# Patient Record
Sex: Female | Born: 1970 | Race: Black or African American | Hispanic: No | Marital: Single | Smoking: Never smoker
Health system: Southern US, Community
[De-identification: ages and names within clinical notes are randomized; demographics above are authoritative.]

## PROBLEM LIST (undated history)

## (undated) DIAGNOSIS — R519 Headache, unspecified: Secondary | ICD-10-CM

## (undated) DIAGNOSIS — I10 Essential (primary) hypertension: Secondary | ICD-10-CM

## (undated) HISTORY — PX: NO PAST SURGERIES: SHX2092

---

## 2004-07-31 ENCOUNTER — Ambulatory Visit (HOSPITAL_COMMUNITY): Admission: RE | Admit: 2004-07-31 | Discharge: 2004-07-31 | Payer: Self-pay | Admitting: General Surgery

## 2016-03-04 ENCOUNTER — Other Ambulatory Visit: Payer: Self-pay

## 2016-03-04 DIAGNOSIS — Z1231 Encounter for screening mammogram for malignant neoplasm of breast: Secondary | ICD-10-CM

## 2016-04-07 ENCOUNTER — Ambulatory Visit: Payer: Self-pay

## 2019-04-14 ENCOUNTER — Other Ambulatory Visit: Payer: Self-pay | Admitting: General Surgery

## 2019-04-15 ENCOUNTER — Other Ambulatory Visit: Payer: Self-pay | Admitting: General Surgery

## 2019-04-15 DIAGNOSIS — K436 Other and unspecified ventral hernia with obstruction, without gangrene: Secondary | ICD-10-CM

## 2019-04-21 ENCOUNTER — Ambulatory Visit
Admission: RE | Admit: 2019-04-21 | Discharge: 2019-04-21 | Disposition: A | Discharge status: Home / Self Care | Payer: BC Managed Care – PPO | Source: Ambulatory Visit | Attending: General Surgery | Admitting: General Surgery

## 2019-04-21 ENCOUNTER — Other Ambulatory Visit: Payer: Self-pay | Admitting: General Surgery

## 2019-04-21 DIAGNOSIS — K436 Other and unspecified ventral hernia with obstruction, without gangrene: Secondary | ICD-10-CM

## 2019-04-21 MED ORDER — IOPAMIDOL (ISOVUE-300) INJECTION 61%
100.0000 mL | Freq: Once | INTRAVENOUS | Status: AC | PRN
  Administered 2019-04-21: 11:00:00 100 mL via INTRAVENOUS

## 2019-04-21 NOTE — Patient Instructions (Addendum)
Roselie AwkwardCynthia M Neidlinger  04/21/2019   Your procedure is scheduled on: Tuesday 04/26/2019  Report [facility]              Report to admitting at  0900 AM              YOU NEED TO HAVE A COVID 19 TEST ON Friday 04/22/2019 at   1120 am  ,             THIS TEST MUST BE DONE BEFORE SURGERY, [facility] BETWEEN THE HOURS OF 900 AM AND 300 PM ON YOUR COVID TEST             DATE.   Call this number if you have problems the morning of surgery [phone]    Remember: Do not eat food or drink liquids :After Midnight.              BRUSH YOUR TEETH MORNING OF SURGERY AND RINSE YOUR MOUTH OUT, NO CHEWING GUM CANDY OR MINTS.     Take these medicines the morning of surgery with A SIP OF WATER: none                                You may not have any metal on your body including hair pins and              piercings  Do not wear jewelry, make-up, lotions, powders or perfumes, deodorant             Do not wear nail polish.  Do not shave  48 hours prior to surgery.            Do not bring valuables to the hospital. [place] IS NOT             RESPONSIBLE   FOR VALUABLES.  Contacts, dentures or bridgework may not be worn into surgery.  Leave suitcase in the car. After surgery it may be brought to your room.     Patients discharged the day of surgery will not be allowed to drive home. IF YOU ARE HAVING SURGERY AND GOING HOME THE SAME DAY, YOU MUST HAVE AN ADULT TO DRIVE YOU HOME AND BE WITH YOU FOR 24 HOURS. YOU MAY GO HOME BY TAXI OR UBER OR ORTHERWISE, BUT AN ADULT MUST ACCOMPANY YOU HOME AND STAY WITH YOU FOR 24 HOURS.  Name and phone number of your driver: daughter- Ardine BjorkJasmine Simpson                Please read over the following fact sheets you were given: _____________________________________________________________________             [facility] - Preparing for [facility] Before surgery, you can play an  important role.  Because skin is not sterile, your skin needs to be as free of germs as possible.  You can reduce the number of germs on your skin by washing with CHG (chlorahexidine gluconate) soap before surgery.  CHG is an antiseptic cleaner which kills germs and bonds with the skin to continue killing germs even after washing. Please DO NOT use if you have an allergy to CHG or antibacterial soaps.  If your skin becomes reddened/irritated stop using the CHG and inform your nurse when you arrive at Short Stay.  Do not shave (including legs and underarms) for at least 48 hours prior to the first CHG shower.  You may shave your face/neck. Please follow these instructions carefully:  1.  Shower with CHG Soap the night before surgery and the  morning of Surgery.  2.  If you choose to wash your hair, wash your hair first as usual with your  normal  shampoo.  3.  After you shampoo, rinse your hair and body thoroughly to remove the  shampoo.                           4.  Use CHG as you would any other liquid soap.  You can apply chg directly  to the skin and wash                       Gently with a scrungie or clean washcloth.  5.  Apply the CHG Soap to your body ONLY FROM THE NECK DOWN.   Do not use on face/ open                           Wound or open sores. Avoid contact with eyes, ears mouth and genitals (private parts).                       Wash face,  Genitals (private parts) with your normal soap.             6.  Wash thoroughly, paying special attention to the area where your surgery  will be performed.  7.  Thoroughly rinse your body with warm water from the neck down.  8.  DO NOT shower/wash with your normal soap after using and rinsing off  the CHG Soap.                9.  Pat yourself dry with a clean towel.            10.  Wear clean pajamas.            11.  Place clean sheets on your bed the night of your first shower and do not  sleep with pets. Day of Surgery : Do not apply any  lotions/deodorants the morning of surgery.  Please wear clean clothes to the hospital/surgery center.  FAILURE TO FOLLOW THESE INSTRUCTIONS MAY RESULT IN THE CANCELLATION OF YOUR SURGERY PATIENT SIGNATURE_________________________________  NURSE SIGNATURE__________________________________  ________________________________________________________________________

## 2019-04-21 NOTE — Progress Notes (Signed)
SPOKE W/  _patient via phone     SCREENING SYMPTOMS OF COVID 19:   COUGH--No  RUNNY NOSE--- No  SORE THROAT---No  NASAL CONGESTION----No  SNEEZING----No  SHORTNESS OF BREATH---No  DIFFICULTY BREATHING---No  TEMP >100.0 -----No  UNEXPLAINED BODY ACHES------No  CHILLS -------No-   HEADACHES ---------No  LOSS OF SMELL/ TASTE --------No    HAVE YOU OR ANY FAMILY MEMBER TRAVELLED PAST 14 DAYS OUT OF THE   COUNTY---No STATE----No COUNTRY----No  HAVE YOU OR ANY FAMILY MEMBER BEEN EXPOSED TO ANYONE WITH COVID 19? No    

## 2019-04-22 ENCOUNTER — Encounter (HOSPITAL_COMMUNITY): Payer: Self-pay

## 2019-04-22 ENCOUNTER — Encounter (HOSPITAL_COMMUNITY)
Admission: RE | Admit: 2019-04-22 | Discharge: 2019-04-22 | Disposition: A | Discharge status: Home / Self Care | Payer: BC Managed Care – PPO | Source: Ambulatory Visit | Attending: General Surgery | Admitting: General Surgery

## 2019-04-22 ENCOUNTER — Other Ambulatory Visit (HOSPITAL_COMMUNITY)
Admission: RE | Admit: 2019-04-22 | Discharge: 2019-04-22 | Disposition: A | Discharge status: Home / Self Care | Payer: BC Managed Care – PPO | Source: Ambulatory Visit | Attending: General Surgery | Admitting: General Surgery

## 2019-04-22 ENCOUNTER — Other Ambulatory Visit: Payer: Self-pay

## 2019-04-22 DIAGNOSIS — Z01812 Encounter for preprocedural laboratory examination: Secondary | ICD-10-CM | POA: Insufficient documentation

## 2019-04-22 DIAGNOSIS — K439 Ventral hernia without obstruction or gangrene: Secondary | ICD-10-CM | POA: Diagnosis not present

## 2019-04-22 DIAGNOSIS — Z1159 Encounter for screening for other viral diseases: Secondary | ICD-10-CM | POA: Insufficient documentation

## 2019-04-22 HISTORY — DX: Headache, unspecified: R51.9

## 2019-04-22 LAB — CBC WITH DIFFERENTIAL/PLATELET
Abs Immature Granulocytes: 0 10*3/uL (ref 0.00–0.07)
Basophils Absolute: 0.1 10*3/uL (ref 0.0–0.1)
Basophils Relative: 1 %
Eosinophils Absolute: 0.1 10*3/uL (ref 0.0–0.5)
Eosinophils Relative: 3 %
HCT: 41.4 % (ref 36.0–46.0)
Hemoglobin: 13.4 g/dL (ref 12.0–15.0)
Immature Granulocytes: 0 %
Lymphocytes Relative: 41 %
Lymphs Abs: 2.2 10*3/uL (ref 0.7–4.0)
MCH: 30.2 pg (ref 26.0–34.0)
MCHC: 32.4 g/dL (ref 30.0–36.0)
MCV: 93.2 fL (ref 80.0–100.0)
Monocytes Absolute: 0.6 10*3/uL (ref 0.1–1.0)
Monocytes Relative: 11 %
Neutro Abs: 2.4 10*3/uL (ref 1.7–7.7)
Neutrophils Relative %: 44 %
Platelets: 334 10*3/uL (ref 150–400)
RBC: 4.44 MIL/uL (ref 3.87–5.11)
RDW: 12.3 % (ref 11.5–15.5)
WBC: 5.4 10*3/uL (ref 4.0–10.5)
nRBC: 0 % (ref 0.0–0.2)

## 2019-04-22 LAB — COMPREHENSIVE METABOLIC PANEL
ALT: 12 U/L (ref 0–44)
AST: 15 U/L (ref 15–41)
Albumin: 4.3 g/dL (ref 3.5–5.0)
Alkaline Phosphatase: 67 U/L (ref 38–126)
Anion gap: 8 (ref 5–15)
BUN: 11 mg/dL (ref 6–20)
CO2: 25 mmol/L (ref 22–32)
Calcium: 9.4 mg/dL (ref 8.9–10.3)
Chloride: 105 mmol/L (ref 98–111)
Creatinine, Ser: 0.87 mg/dL (ref 0.44–1.00)
GFR calc Af Amer: >60 mL/min (ref >60)
GFR calc non Af Amer: >60 mL/min (ref >60)
Glucose, Bld: 102 mg/dL — ABNORMAL HIGH (ref 70–99)
Potassium: 3.6 mmol/L (ref 3.5–5.1)
Sodium: 138 mmol/L (ref 135–145)
Total Bilirubin: 0.7 mg/dL (ref 0.3–1.2)
Total Protein: 7.7 g/dL (ref 6.5–8.1)

## 2019-04-22 LAB — HCG, SERUM, QUALITATIVE: Preg, Serum: NEGATIVE

## 2019-04-23 LAB — NOVEL CORONAVIRUS, NAA (HOSP ORDER, SEND-OUT TO REF LAB; TAT 18-24 HRS): SARS-CoV-2, NAA: NOT DETECTED

## 2019-04-25 NOTE — H&P (Signed)
Carolyn Mclaughlin [facility] Patient #: [id number] DOB: [birth year] Single / Language: [place] P[place] / Race: Black or African American Female        History of Present Illness       This is a very pleasant 48 year old woman from [place]. Referred from Dr. Wende CreaseGuarino at one of the urgent care centers in [place] for a hernia above the umbilicus. Her regular PCP is Dr. Nettie ElmFusco. Jacky served as my chaperone for the encounter.      She has felt a lump in the midline of the abdomen above the umbilicus for about 2 years but think much of it. Recently she sneezed and it got larger and very painful and she went to urgent care. This is only partially reducible. Her symptoms improved. No nausea or vomiting. She went home and was referred to surgery.       Past history is negative. She's never had any abdominal surgery or C-section. Has no medical problems. Takes no medications. EMR 1634 Family history reveals mother died of a seizure at age 48. Father died at age 48 from diabetes and renal failure following renal transplant Socially she is single has 2 children. 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 Denies tobacco. Drinks alcohol only occasionally. She lives alone. She has a close friend nearby. Her children do not live nearby but other family members today.     On exam she has a tender, incarcerated, nonreducible mass in the epigastrium about 2 cm above the umbilicus. The skin looks healthy. There is no inflammation.      Because this is likely incarcerated and tender advised proceeding with surgical planning at this time and she agrees She'll be set up for a CT scan of the abdomen and pelvis immediately to be sure there is no intestine caught in this     She'll be scheduled for laparoscopic repair of incarcerated epigastric hernia with mesh. She knows this may have to be converted to an open operation. I discussed the indications,  details, techniques, and numerous risk of the surgery with her. She is aware of this bleeding, infection, conversion to open laparotomy, chronic pain, recurrence of the hernia, injury to adjacent organs as the intestine with major reconstructive surgery. She no she'll be out of work for about a month or less. She understands all these issues. All questions were answered. She agrees with this plan.  Plan: CT abdomen and pelvis ASAP Schedule surgery as above OP EV Intraoperative, laparoscopic controlled TAPP blocks Preop orders completed    Addendum Note CT shows small umbilical hernia and larger hernia in midline 3.6 cm. cephalad to umbilicus. No bowel involved. Should be amenable to repair with one piece of mesh so long as reducible laparoscopically I called patient and discussed with her. Surgery scheduled 04/26/2019.   Past Surgical History  No pertinent past surgical history   Diagnostic Studies History Colonoscopy  never Mammogram  1-3 years ago Pap Smear  1-5 years ago  Allergies No Known Drug Allergies  Allergies Reconciled   Medication History  Nabumetone (750MG  Tablet, Oral as needed) Active. Medications Reconciled  Social History  Alcohol use  Occasional alcohol use. Caffeine use  Carbonated beverages. No drug use  Tobacco use  Never smoker.  Family History  Diabetes Mellitus  Father. Kidney Disease  Father. Seizure disorder  Mother.  Pregnancy / Birth History Age at menarche  15 years. Age of menopause  4546-50 Gravida  2 Irregular periods  Maternal age  33-20 Para  2    Review of Systems  General Not Present- Appetite Loss, Chills, Fatigue, Fever, Night Sweats, Weight Gain and Weight Loss. Skin Not Present- Change in Wart/Mole, Dryness, Hives, Jaundice, New Lesions, Non-Healing Wounds, Rash and Ulcer. HEENT Present- Wears glasses/contact lenses. Not Present- Earache, Hearing Loss, Hoarseness, Nose Bleed, Oral  Ulcers, Ringing in the Ears, Seasonal Allergies, Sinus Pain, Sore Throat, Visual Disturbances and Yellow Eyes. Respiratory Not Present- Bloody sputum, Chronic Cough, Difficulty Breathing, Snoring and Wheezing. Breast Not Present- Breast Mass, Breast Pain, Nipple Discharge and Skin Changes. Cardiovascular Not Present- Chest Pain, Difficulty Breathing Lying Down, Leg Cramps, Palpitations, Rapid Heart Rate, Shortness of Breath and Swelling of Extremities. Gastrointestinal Not Present- Abdominal Pain, Bloating, Bloody Stool, Change in Bowel Habits, Chronic diarrhea, Constipation, Difficulty Swallowing, Excessive gas, Gets full quickly at meals, Hemorrhoids, Indigestion, Nausea, Rectal Pain and Vomiting. Female Genitourinary Not Present- Frequency, Nocturia, Painful Urination, Pelvic Pain and Urgency. Musculoskeletal Not Present- Back Pain, Joint Pain, Joint Stiffness, Muscle Pain, Muscle Weakness and Swelling of Extremities. Neurological Not Present- Decreased Memory, Fainting, Headaches, Numbness, Seizures, Tingling, Tremor, Trouble walking and Weakness. Psychiatric Not Present- Anxiety, Bipolar, Change in Sleep Pattern, Depression, Fearful and Frequent crying. Endocrine Not Present- Cold Intolerance, Excessive Hunger, Hair Changes, Heat Intolerance, Hot flashes and New Diabetes. Hematology Not Present- Blood Thinners, Easy Bruising, Excessive bleeding, Gland problems, HIV and Persistent Infections.  Vitals  Weight: 186.8 lb Height: 62in Body Surface Area: 1.86 m Body Mass Index: 34.17 kg/m  Temp.: 98.60F(Oral)  Pulse: 89 (Regular)  P.OX: 98% (Room air) BP: 130/94 (Sitting, Left Arm, Standard)     Physical Exam General Mental Status-Alert. General Appearance-Consistent with stated age. Hydration-Well hydrated. Voice-Normal. Note: Extremely pleasant and cooperative. BMI 34. Subjectively not in any distress   Head and Neck Head-normocephalic, atraumatic with no  lesions or palpable masses. Trachea-midline. Thyroid Gland Characteristics - normal size and consistency.  Eye Eyeball - Bilateral-Extraocular movements intact. Sclera/Conjunctiva - Bilateral-No scleral icterus.  Chest and Lung Exam Chest and lung exam reveals -quiet, even and easy respiratory effort with no use of accessory muscles and on auscultation, normal breath sounds, no adventitious sounds and normal vocal resonance. Inspection Chest Wall - Normal. Back - normal.  Cardiovascular Cardiovascular examination reveals -normal heart sounds, regular rate and rhythm with no murmurs and normal pedal pulses bilaterally.  Abdomen Inspection  Inspection of the abdomen reveals: Note: Examined supine and standing. There is a firm nonreducible soft tissue mass in the abdominal wall, midline, above the umbilicus. A 5 cm in diameter. I cannot feel the defect since it is incarcerated. The skin is not inflamed. Umbilicus relatively normal. No inguinal mass. No organomegaly. Skin - Scar - no surgical scars. Palpation/Percussion Palpation and Percussion of the abdomen reveal - Soft, Non Tender, No Rebound tenderness, No Rigidity (guarding) and No hepatosplenomegaly. Auscultation Auscultation of the abdomen reveals - Bowel sounds normal.  Neurologic Neurologic evaluation reveals -alert and oriented x 3 with no impairment of recent or remote memory. Mental Status-Normal.  Musculoskeletal Normal Exam - Left-Upper Extremity Strength Normal and Lower Extremity Strength Normal. Normal Exam - Right-Upper Extremity Strength Normal and Lower Extremity Strength Normal.  Lymphatic Head & Neck  General Head & Neck Lymphatics: Bilateral - Description - Normal. Axillary  General Axillary Region: Bilateral - Description - Normal. Tenderness - Non Tender. Femoral & Inguinal  Generalized Femoral & Inguinal Lymphatics: Bilateral - Description - Normal. Tenderness - Non  Tender.     Assessment &  Plan   EPIGASTRIC HERNIA WITH OBSTRUCTION BUT NO GANGRENE (K43.6).  You have a nonreducible painful lump above your umbilicus Almost certainly this is an incarcerated epigastric hernia Most likely contains only fatty tissue such as the omentum Less likely the intestine is involved  Because this is incarcerated and tender, I recommend that we proceed with surgical planning You agree  you will be scheduled for CT scan of abdomen and pelvis immediately to make sure we understand the contents of the hernia and the size of the muscle defect you will also be scheduled for laparoscopic repair of incarcerated epigastric hernia with mesh, possible open repair  Dr. Derrell Lolling has discussed the indications, techniques, and risks of the surgery with you in detail.  BMI 34.0-34.9,ADULT (Z68.34)   Angelia Mould. Derrell Lolling, M.D., [facility] General and Minimally invasive Surgery Breast and Colorectal Surgery Office:   [phone] Pager:   [phone]

## 2019-04-25 NOTE — Progress Notes (Signed)
SPOKE W/  _patient     SCREENING SYMPTOMS OF COVID 19:   COUGH--no  RUNNY NOSE--- no  SORE THROAT---no  NASAL CONGESTION----no  SNEEZING----no  SHORTNESS OF BREATH---no  DIFFICULTY BREATHING---no  TEMP >100.0 -----no  UNEXPLAINED BODY ACHES------no  CHILLS --------no   HEADACHES ---------no  LOSS OF SMELL/ TASTE --------no    HAVE YOU OR ANY FAMILY MEMBER TRAVELLED PAST 14 DAYS OUT OF THE   COUNTY---no STATE----no COUNTRY----no  HAVE YOU OR ANY FAMILY MEMBER BEEN EXPOSED TO ANYONE WITH COVID 19? no    

## 2019-04-26 ENCOUNTER — Ambulatory Visit (HOSPITAL_COMMUNITY): Payer: BC Managed Care – PPO | Admitting: Certified Registered Nurse Anesthetist

## 2019-04-26 ENCOUNTER — Encounter (HOSPITAL_COMMUNITY): Payer: Self-pay | Admitting: Emergency Medicine

## 2019-04-26 ENCOUNTER — Other Ambulatory Visit: Payer: Self-pay

## 2019-04-26 ENCOUNTER — Ambulatory Visit (HOSPITAL_COMMUNITY)
Admission: RE | Admit: 2019-04-26 | Discharge: 2019-04-27 | Disposition: A | Discharge status: Home / Self Care | Payer: BC Managed Care – PPO | Attending: General Surgery | Admitting: General Surgery

## 2019-04-26 ENCOUNTER — Encounter (HOSPITAL_COMMUNITY)
Admission: RE | Disposition: A | Discharge status: Home / Self Care | Payer: Self-pay | Source: Home / Self Care | Attending: General Surgery

## 2019-04-26 ENCOUNTER — Ambulatory Visit (HOSPITAL_COMMUNITY): Payer: BC Managed Care – PPO | Admitting: Physician Assistant

## 2019-04-26 DIAGNOSIS — Z841 Family history of disorders of kidney and ureter: Secondary | ICD-10-CM | POA: Insufficient documentation

## 2019-04-26 DIAGNOSIS — Z833 Family history of diabetes mellitus: Secondary | ICD-10-CM | POA: Insufficient documentation

## 2019-04-26 DIAGNOSIS — Z820 Family history of epilepsy and other diseases of the nervous system: Secondary | ICD-10-CM | POA: Diagnosis not present

## 2019-04-26 DIAGNOSIS — K436 Other and unspecified ventral hernia with obstruction, without gangrene: Secondary | ICD-10-CM | POA: Diagnosis not present

## 2019-04-26 DIAGNOSIS — K429 Umbilical hernia without obstruction or gangrene: Secondary | ICD-10-CM | POA: Diagnosis present

## 2019-04-26 HISTORY — DX: Umbilical hernia without obstruction or gangrene: K42.9

## 2019-04-26 HISTORY — PX: VENTRAL HERNIA REPAIR: SHX424

## 2019-04-26 HISTORY — DX: Other and unspecified ventral hernia with obstruction, without gangrene: K43.6

## 2019-04-26 LAB — CBC
HCT: 37.4 % (ref 36.0–46.0)
Hemoglobin: 12.5 g/dL (ref 12.0–15.0)
MCH: 31.3 pg (ref 26.0–34.0)
MCHC: 33.4 g/dL (ref 30.0–36.0)
MCV: 93.7 fL (ref 80.0–100.0)
Platelets: 307 10*3/uL (ref 150–400)
RBC: 3.99 MIL/uL (ref 3.87–5.11)
RDW: 12.1 % (ref 11.5–15.5)
WBC: 12 10*3/uL — ABNORMAL HIGH (ref 4.0–10.5)
nRBC: 0 % (ref 0.0–0.2)

## 2019-04-26 LAB — CREATININE, SERUM
Creatinine, Ser: 0.86 mg/dL (ref 0.44–1.00)
GFR calc Af Amer: >60 mL/min (ref >60)
GFR calc non Af Amer: >60 mL/min (ref >60)

## 2019-04-26 SURGERY — REPAIR, HERNIA, VENTRAL, LAPAROSCOPIC
Anesthesia: General

## 2019-04-26 MED ORDER — PROPOFOL 10 MG/ML IV BOLUS
INTRAVENOUS | Status: AC
  Filled 2019-04-26: qty 20

## 2019-04-26 MED ORDER — CELECOXIB 200 MG PO CAPS
200.0000 mg | ORAL_CAPSULE | Freq: Two times a day (BID) | ORAL | Status: DC
  Administered 2019-04-26 – 2019-04-27 (×2): 200 mg via ORAL
  Filled 2019-04-26 (×2): qty 1

## 2019-04-26 MED ORDER — LACTATED RINGERS IV SOLN
INTRAVENOUS | Status: DC
  Administered 2019-04-26: 15:00:00 via INTRAVENOUS

## 2019-04-26 MED ORDER — ENOXAPARIN SODIUM 40 MG/0.4ML ~~LOC~~ SOLN
40.0000 mg | SUBCUTANEOUS | Status: DC
  Administered 2019-04-27: 10:00:00 40 mg via SUBCUTANEOUS
  Filled 2019-04-26: qty 0.4

## 2019-04-26 MED ORDER — LIDOCAINE 2% (20 MG/ML) 5 ML SYRINGE
INTRAMUSCULAR | Status: AC
  Filled 2019-04-26: qty 5

## 2019-04-26 MED ORDER — ROCURONIUM BROMIDE 50 MG/5ML IV SOSY
PREFILLED_SYRINGE | INTRAVENOUS | Status: DC | PRN
  Administered 2019-04-26: 50 mg via INTRAVENOUS
  Administered 2019-04-26: 10 mg via INTRAVENOUS

## 2019-04-26 MED ORDER — BUPIVACAINE-EPINEPHRINE (PF) 0.25% -1:200000 IJ SOLN
INTRAMUSCULAR | Status: AC
  Filled 2019-04-26: qty 30

## 2019-04-26 MED ORDER — ONDANSETRON HCL 4 MG/2ML IJ SOLN
INTRAMUSCULAR | Status: DC | PRN
  Administered 2019-04-26: 4 mg via INTRAVENOUS

## 2019-04-26 MED ORDER — PHENYLEPHRINE 40 MCG/ML (10ML) SYRINGE FOR IV PUSH (FOR BLOOD PRESSURE SUPPORT)
PREFILLED_SYRINGE | INTRAVENOUS | Status: AC
  Filled 2019-04-26: qty 10

## 2019-04-26 MED ORDER — BUPIVACAINE-EPINEPHRINE 0.5% -1:200000 IJ SOLN: INTRAMUSCULAR | Status: DC | PRN

## 2019-04-26 MED ORDER — MIDAZOLAM HCL 5 MG/5ML IJ SOLN
INTRAMUSCULAR | Status: DC | PRN
  Administered 2019-04-26: 2 mg via INTRAVENOUS

## 2019-04-26 MED ORDER — HYDROMORPHONE HCL 1 MG/ML IJ SOLN
1.0000 mg | INTRAMUSCULAR | Status: DC | PRN
  Administered 2019-04-26: 22:00:00 1 mg via INTRAVENOUS
  Filled 2019-04-26: qty 1

## 2019-04-26 MED ORDER — GABAPENTIN 300 MG PO CAPS
300.0000 mg | ORAL_CAPSULE | ORAL | Status: AC
  Administered 2019-04-26: 300 mg via ORAL
  Filled 2019-04-26: qty 1

## 2019-04-26 MED ORDER — SUCCINYLCHOLINE CHLORIDE 200 MG/10ML IV SOSY
PREFILLED_SYRINGE | INTRAVENOUS | Status: AC
  Filled 2019-04-26: qty 10

## 2019-04-26 MED ORDER — ONDANSETRON HCL 4 MG/2ML IJ SOLN
4.0000 mg | Freq: Four times a day (QID) | INTRAMUSCULAR | Status: DC | PRN
  Administered 2019-04-27: 4 mg via INTRAVENOUS
  Filled 2019-04-26 (×2): qty 2

## 2019-04-26 MED ORDER — STERILE WATER FOR INJECTION IJ SOLN
INTRAMUSCULAR | Status: AC
  Filled 2019-04-26: qty 20

## 2019-04-26 MED ORDER — DEXAMETHASONE SODIUM PHOSPHATE 10 MG/ML IJ SOLN
INTRAMUSCULAR | Status: DC | PRN
  Administered 2019-04-26: 5 mg via INTRAVENOUS

## 2019-04-26 MED ORDER — DEXAMETHASONE SODIUM PHOSPHATE 10 MG/ML IJ SOLN
INTRAMUSCULAR | Status: AC
  Filled 2019-04-26: qty 1

## 2019-04-26 MED ORDER — HYDROCODONE-ACETAMINOPHEN 5-325 MG PO TABS
1.0000 | ORAL_TABLET | ORAL | Status: DC | PRN
  Administered 2019-04-27 (×2): 2 via ORAL
  Filled 2019-04-26 (×2): qty 2

## 2019-04-26 MED ORDER — CEFAZOLIN SODIUM-DEXTROSE 2-4 GM/100ML-% IV SOLN
2.0000 g | INTRAVENOUS | Status: AC
  Administered 2019-04-26: 10:00:00 2 g via INTRAVENOUS
  Filled 2019-04-26: qty 100

## 2019-04-26 MED ORDER — BUPIVACAINE-EPINEPHRINE (PF) 0.5% -1:200000 IJ SOLN
INTRAMUSCULAR | Status: AC
  Filled 2019-04-26: qty 30

## 2019-04-26 MED ORDER — SUGAMMADEX SODIUM 500 MG/5ML IV SOLN
INTRAVENOUS | Status: AC
  Filled 2019-04-26: qty 5

## 2019-04-26 MED ORDER — SUCCINYLCHOLINE CHLORIDE 200 MG/10ML IV SOSY
PREFILLED_SYRINGE | INTRAVENOUS | Status: DC | PRN
  Administered 2019-04-26: 160 mg via INTRAVENOUS

## 2019-04-26 MED ORDER — BUPIVACAINE LIPOSOME 1.3 % IJ SUSP
20.0000 mL | Freq: Once | INTRAMUSCULAR | Status: AC
  Administered 2019-04-26: 12:00:00 20 mL
  Filled 2019-04-26: qty 20

## 2019-04-26 MED ORDER — CELECOXIB 200 MG PO CAPS
200.0000 mg | ORAL_CAPSULE | ORAL | Status: AC
  Administered 2019-04-26: 10:00:00 200 mg via ORAL
  Filled 2019-04-26: qty 1

## 2019-04-26 MED ORDER — FENTANYL CITRATE (PF) 250 MCG/5ML IJ SOLN
INTRAMUSCULAR | Status: AC
  Filled 2019-04-26: qty 5

## 2019-04-26 MED ORDER — 0.9 % SODIUM CHLORIDE (POUR BTL) OPTIME
TOPICAL | Status: DC | PRN
  Administered 2019-04-26: 11:00:00 1000 mL

## 2019-04-26 MED ORDER — CEFAZOLIN SODIUM-DEXTROSE 2-4 GM/100ML-% IV SOLN
2.0000 g | Freq: Three times a day (TID) | INTRAVENOUS | Status: AC
  Administered 2019-04-26: 18:00:00 2 g via INTRAVENOUS
  Filled 2019-04-26: qty 100

## 2019-04-26 MED ORDER — EPHEDRINE 5 MG/ML INJ
INTRAVENOUS | Status: AC
  Filled 2019-04-26: qty 10

## 2019-04-26 MED ORDER — ROCURONIUM BROMIDE 10 MG/ML (PF) SYRINGE
PREFILLED_SYRINGE | INTRAVENOUS | Status: AC
  Filled 2019-04-26: qty 10

## 2019-04-26 MED ORDER — SODIUM CHLORIDE (PF) 0.9 % IJ SOLN
INTRAMUSCULAR | Status: DC | PRN
  Administered 2019-04-26: 20 mL via INTRAVENOUS

## 2019-04-26 MED ORDER — ACETAMINOPHEN 500 MG PO TABS
1000.0000 mg | ORAL_TABLET | ORAL | Status: AC
  Administered 2019-04-26: 09:00:00 1000 mg via ORAL
  Filled 2019-04-26: qty 2

## 2019-04-26 MED ORDER — SENNA 8.6 MG PO TABS
1.0000 | ORAL_TABLET | Freq: Two times a day (BID) | ORAL | Status: DC
  Administered 2019-04-26 – 2019-04-27 (×2): 8.6 mg via ORAL
  Filled 2019-04-26 (×2): qty 1

## 2019-04-26 MED ORDER — FENTANYL CITRATE (PF) 100 MCG/2ML IJ SOLN
INTRAMUSCULAR | Status: DC | PRN
  Administered 2019-04-26: 50 ug via INTRAVENOUS
  Administered 2019-04-26: 25 ug via INTRAVENOUS
  Administered 2019-04-26 (×2): 50 ug via INTRAVENOUS
  Administered 2019-04-26: 25 ug via INTRAVENOUS

## 2019-04-26 MED ORDER — SUGAMMADEX SODIUM 500 MG/5ML IV SOLN
INTRAVENOUS | Status: DC | PRN
  Administered 2019-04-26: 200 mg via INTRAVENOUS

## 2019-04-26 MED ORDER — TRAMADOL HCL 50 MG PO TABS: 50.0000 mg | ORAL_TABLET | Freq: Four times a day (QID) | ORAL | Status: DC | PRN

## 2019-04-26 MED ORDER — ONDANSETRON 4 MG PO TBDP: 4.0000 mg | ORAL_TABLET | Freq: Four times a day (QID) | ORAL | Status: DC | PRN

## 2019-04-26 MED ORDER — LACTATED RINGERS IV SOLN
INTRAVENOUS | Status: DC
  Administered 2019-04-26: 09:00:00 via INTRAVENOUS

## 2019-04-26 MED ORDER — GABAPENTIN 300 MG PO CAPS
300.0000 mg | ORAL_CAPSULE | Freq: Two times a day (BID) | ORAL | Status: DC
  Administered 2019-04-26 – 2019-04-27 (×2): 300 mg via ORAL
  Filled 2019-04-26 (×2): qty 1

## 2019-04-26 MED ORDER — SODIUM CHLORIDE (PF) 0.9 % IJ SOLN
INTRAMUSCULAR | Status: AC
  Filled 2019-04-26: qty 20

## 2019-04-26 MED ORDER — MIDAZOLAM HCL 2 MG/2ML IJ SOLN
INTRAMUSCULAR | Status: AC
  Filled 2019-04-26: qty 2

## 2019-04-26 MED ORDER — CHLORHEXIDINE GLUCONATE CLOTH 2 % EX PADS: 6.0000 | MEDICATED_PAD | Freq: Once | CUTANEOUS | Status: DC

## 2019-04-26 MED ORDER — FENTANYL CITRATE (PF) 100 MCG/2ML IJ SOLN: 25.0000 ug | INTRAMUSCULAR | Status: DC | PRN

## 2019-04-26 MED ORDER — ONDANSETRON HCL 4 MG/2ML IJ SOLN
INTRAMUSCULAR | Status: AC
  Filled 2019-04-26: qty 2

## 2019-04-26 MED ORDER — LIDOCAINE 2% (20 MG/ML) 5 ML SYRINGE
INTRAMUSCULAR | Status: DC | PRN
  Administered 2019-04-26: 80 mg via INTRAVENOUS

## 2019-04-26 MED ORDER — PROPOFOL 10 MG/ML IV BOLUS
INTRAVENOUS | Status: DC | PRN
  Administered 2019-04-26: 200 mg via INTRAVENOUS

## 2019-04-26 MED ORDER — METHOCARBAMOL 500 MG PO TABS
500.0000 mg | ORAL_TABLET | Freq: Four times a day (QID) | ORAL | Status: DC | PRN
  Administered 2019-04-27: 10:00:00 500 mg via ORAL
  Filled 2019-04-26: qty 1

## 2019-04-26 SURGICAL SUPPLY — 45 items
APPLIER CLIP 5 13 M/L LIGAMAX5 (MISCELLANEOUS)
BENZOIN TINCTURE PRP APPL 2/3 (GAUZE/BANDAGES/DRESSINGS) IMPLANT
BINDER ABDOMINAL 12 ML 46-62 (SOFTGOODS) ×2 IMPLANT
CABLE HIGH FREQUENCY MONO STRZ (ELECTRODE) ×2 IMPLANT
CLIP APPLIE 5 13 M/L LIGAMAX5 (MISCELLANEOUS) IMPLANT
CLOSURE WOUND 1/2 X4 (GAUZE/BANDAGES/DRESSINGS)
COVER SURGICAL LIGHT HANDLE (MISCELLANEOUS) ×3 IMPLANT
COVER WAND RF STERILE (DRAPES) IMPLANT
DECANTER SPIKE VIAL GLASS SM (MISCELLANEOUS) ×7 IMPLANT
DERMABOND ADVANCED (GAUZE/BANDAGES/DRESSINGS) ×2
DERMABOND ADVANCED .7 DNX12 (GAUZE/BANDAGES/DRESSINGS) IMPLANT
DEVICE SECURE STRAP 25 ABSORB (INSTRUMENTS) ×4 IMPLANT
DEVICE TROCAR PUNCTURE CLOSURE (ENDOMECHANICALS) ×3 IMPLANT
DRSG PAD ABDOMINAL 8X10 ST (GAUZE/BANDAGES/DRESSINGS) ×2 IMPLANT
ELECT REM PT RETURN 15FT ADLT (MISCELLANEOUS) ×3 IMPLANT
GLOVE EUDERMIC 7 POWDERFREE (GLOVE) ×3 IMPLANT
GOWN STRL REUS W/TWL XL LVL3 (GOWN DISPOSABLE) ×6 IMPLANT
KIT BASIN OR (CUSTOM PROCEDURE TRAY) ×3 IMPLANT
KIT TURNOVER KIT A (KITS) IMPLANT
MARKER SKIN DUAL TIP RULER LAB (MISCELLANEOUS) ×3 IMPLANT
MESH VENTRALIGHT ST 6X8 (Mesh Specialty) ×2 IMPLANT
MESH VENTRLGHT ELLIPSE 8X6XMFL (Mesh Specialty) IMPLANT
NDL SPNL 22GX3.5 QUINCKE BK (NEEDLE) ×1 IMPLANT
NEEDLE SPNL 22GX3.5 QUINCKE BK (NEEDLE) ×3 IMPLANT
PAD POSITIONING PINK XL (MISCELLANEOUS) IMPLANT
PROTECTOR NERVE ULNAR (MISCELLANEOUS) IMPLANT
SCISSORS LAP 5X35 DISP (ENDOMECHANICALS) ×3 IMPLANT
SET IRRIG TUBING LAPAROSCOPIC (IRRIGATION / IRRIGATOR) IMPLANT
SET TUBE SMOKE EVAC HIGH FLOW (TUBING) ×5 IMPLANT
SHEARS HARMONIC ACE PLUS 36CM (ENDOMECHANICALS) ×2 IMPLANT
SLEEVE ADV FIXATION 5X100MM (TROCAR) ×3 IMPLANT
SLEEVE XCEL OPT CAN 5 100 (ENDOMECHANICALS) IMPLANT
STRIP CLOSURE SKIN 1/2X4 (GAUZE/BANDAGES/DRESSINGS) IMPLANT
SUT MNCRL AB 4-0 PS2 18 (SUTURE) ×3 IMPLANT
SUT NOVA 0 T19/GS 22DT (SUTURE) IMPLANT
SUT NOVA NAB DX-16 0-1 5-0 T12 (SUTURE) ×2 IMPLANT
TACKER 5MM HERNIA 3.5CML NAB (ENDOMECHANICALS) IMPLANT
TAPE CLOTH 4X10 WHT NS (GAUZE/BANDAGES/DRESSINGS) IMPLANT
TOWEL OR 17X26 10 PK STRL BLUE (TOWEL DISPOSABLE) ×3 IMPLANT
TOWEL OR NON WOVEN STRL DISP B (DISPOSABLE) ×3 IMPLANT
TRAY FOLEY MTR SLVR 16FR STAT (SET/KITS/TRAYS/PACK) ×1 IMPLANT
TRAY LAPAROSCOPIC (CUSTOM PROCEDURE TRAY) ×3 IMPLANT
TROCAR ADV FIXATION 5X100MM (TROCAR) ×3 IMPLANT
TROCAR BLADELESS OPT 5 100 (ENDOMECHANICALS) IMPLANT
TROCAR XCEL NON-BLD 11X100MML (ENDOMECHANICALS) ×3 IMPLANT

## 2019-04-26 NOTE — Op Note (Signed)
Patient Name:           Carolyn Mclaughlin   Date of Surgery:        04/26/2019  Pre op Diagnosis:      Incarcerated epigastric hernia                                      Umbilical hernia without obstruction or gangrene  Post op Diagnosis:    Same  Procedure:                 Laparoscopic reduction and repair of incarcerated epigastric hernia and umbilical hernia with underlay  mesh  Surgeon:                     Angelia Mould. Derrell Lolling, M.D., FACS  Assistant:                      Or staff  Operative Indications:    This is a very pleasant 48 year old woman from [place]. Referred from Dr. Wende Crease at one of the urgent care centers in [place] for a hernia above the umbilicus. Her regular PCP is Dr. Sherwood Gambler.       She has felt a lump in the midline of the abdomen above the umbilicus for about 2 years but did not think much of it. Recently she sneezed and it got larger and very painful and she went to urgent care. This is only partially reducible. Her symptoms improved. No nausea or vomiting. She went home and was referred to surgery.       Past history is negative. She's never had any abdominal surgery or C-section. Has no medical problems. Takes no medications. BMI 34     On exam she has a tender, incarcerated, nonreducible mass in the epigastrium about 2 cm above the umbilicus. The size of the hernia sac is probably 6 cm in diameter but the defect feels much smaller the skin looks healthy. There is no inflammation.     CT shows small umbilical hernia and larger hernia in midline 3.6 cm. cephalad to umbilicus. No bowel involved. Should be amenable to repair with one piece of mesh so long as reducible laparoscopically     She'll be scheduled for laparoscopic repair of incarcerated epigastric hernia with mesh. She knows this may have to be converted to an open operation. She agrees with this plan.    Operative Findings:       The epigastric hernia defect was about 2 cm.  The  umbilical defect was probably less than 2 cm.  The incarcerated omentum that was caught in the epigastric hernia was partially reduced after she was put to sleep and I had to debride the rest of it out of the hernia sac with the harmonic scalpel.  I took down the falciform ligament and created a large platform for the mesh.  I used a 20 cm vertical by 15 cm transverse ventral light ST composite mesh.  This allowed 5 to 7 cm overlap in all areas.  Four-quadrant inspection at the beginning of the case and at the end of the case showed no bleeding, injury, visceral or peritoneal disease.  Procedure in Detail:          Following the induction of general endotracheal anesthesia the patient's abdomen was prepped and draped in a sterile fashion.  Intravenous antibiotics were given.  Surgical  timeout was performed.  I used a 50% solution of Exparel for local infiltration at the trocar sites and also at the end of the case did a bilateral TA PP block with this under direct vision.    A Veress needle was inserted in the left subcostal site and tested.  Insufflation to 15 mmHg was performed.  The Veress needle was removed and a 5 mm optical trocar was inserted without difficulty.  Video camera was inserted and four-quadrant inspection revealed findings as described above.  11 mm trocar was placed in the left flank and 5 mm trocar placed in the left lower quadrant.  Ultimately I placed two 5 mm trochars on the right side.  Using the harmonic scalpel I slowly debrided all of the omentum out of the hernia sac.  Some of this was removed from the abdominal cavity.  I took down some of the fatty tissue under the umbilicus.  The falciform ligament was likewise extensively mobilized.  I drew a template on the abdominal wall using a marking pen.  The mesh was brought to the operative field.  I placed #1 Novafil suture and the mesh at 12:00, 3:00, 6:00, and 9 o'clock position according to the template.  The mesh was moistened, rolled  up and inserted through the large trocar, opened up and positioned within the abdominal cavity.  Great care was taken to make sure that the rough side of the mesh was up toward the abdominal wall and the smooth side was down toward the viscera.    A small puncture wound was made at the 4 suture fixation sites.  Endo Close device was used to draw the Novafil sutures up through the abdominal wall.  I was careful to take at least a 1 cm bite fascia.  After this was done the sutures were lifted up and the mesh deployed nicely.  All all of the sutures were tied and then the tails cut.  The mesh was further sutured to the abdominal wall in a double crown technique using a secure strap device.  50 firings of the device were performed.  This provided very secure fixation.  I was very careful to avoid having any gaps in the mesh at the perimeter of the fixation.  Four-quadrant inspection was good.  There was no bleeding.  The pneumoperitoneum was released through the closed system.  The trochars were removed.  The skin incisions were closed with subcuticular sutures of 4-0 Monocryl and Dermabond.  Abdominal binder was placed and the patient taken to PACU in stable condition.  EBL 15 cc.  Counts correct.  Complications none.   Addendum: I logged onto the PMP aware website and reviewed her prescription medication history.     Angelia MouldHaywood M. Derrell LollingIngram, M.D., FACS General and Minimally Invasive Surgery Breast and Colorectal Surgery  04/26/2019 11:34 AM

## 2019-04-26 NOTE — Anesthesia Procedure Notes (Signed)
Procedure Name: Intubation Date/Time: 04/26/2019 10:14 AM Performed by: West Pugh, CRNA Pre-anesthesia Checklist: Patient identified, Emergency Drugs available, Suction available, Patient being monitored and Timeout performed Patient Re-evaluated:Patient Re-evaluated prior to induction Oxygen Delivery Method: Circle system utilized Preoxygenation: Pre-oxygenation with 100% oxygen Induction Type: IV induction Laryngoscope Size: Mac and 3 Grade View: Grade II Tube type: Oral Tube size: 7.0 mm Number of attempts: 1 Airway Equipment and Method: Stylet Placement Confirmation: ETT inserted through vocal cords under direct vision,  positive ETCO2,  CO2 detector and breath sounds checked- equal and bilateral Secured at: 20 cm Tube secured with: Tape Dental Injury: Teeth and Oropharynx as per pre-operative assessment

## 2019-04-26 NOTE — Transfer of Care (Signed)
Immediate Anesthesia Transfer of Care Note  Patient: Carolyn Mclaughlin  Procedure(s) Performed: LAPRASCOPIC REPAIR OF INCARCERATED EPIGASTRIC HERNIA WITH MESH (N/A )  Patient Location: PACU  Anesthesia Type:General  Level of Consciousness: awake and patient cooperative  Airway & Oxygen Therapy: Patient Spontanous Breathing and Patient connected to face mask oxygen  Post-op Assessment: Report given to RN and Post -op Vital signs reviewed and stable  Post vital signs: Reviewed and stable  Last Vitals:  Vitals Value Taken Time  BP 153/99 04/26/2019 11:45 AM  Temp    Pulse 91 04/26/2019 11:47 AM  Resp 15 04/26/2019 11:47 AM  SpO2 100 % 04/26/2019 11:47 AM  Vitals shown include unvalidated device data.  Last Pain:  Vitals:   04/26/19 0925  TempSrc:   PainSc: 0-No pain      Patients Stated Pain Goal: 4 (04/26/19 0925)  Complications: No apparent anesthesia complications

## 2019-04-26 NOTE — Anesthesia Preprocedure Evaluation (Addendum)
Anesthesia Evaluation  Patient identified by MRN, date of birth, ID band Patient awake    Reviewed: Allergy & Precautions, NPO status , Patient's Chart, lab work & pertinent test results  Airway Mallampati: I  TM Distance: >3 FB Neck ROM: Full    Dental no notable dental hx. (+) Teeth Intact, Dental Advisory Given   Pulmonary neg pulmonary ROS,    Pulmonary exam normal breath sounds clear to auscultation       Cardiovascular negative cardio ROS Normal cardiovascular exam Rhythm:Regular Rate:Normal     Neuro/Psych negative neurological ROS  negative psych ROS   GI/Hepatic negative GI ROS, Neg liver ROS,   Endo/Other  negative endocrine ROS  Renal/GU negative Renal ROS  negative genitourinary   Musculoskeletal negative musculoskeletal ROS (+)   Abdominal   Peds negative pediatric ROS (+)  Hematology negative hematology ROS (+)   Anesthesia Other Findings   Reproductive/Obstetrics negative OB ROS                             Anesthesia Physical Anesthesia Plan  ASA: II  Anesthesia Plan: General   Post-op Pain Management:    Induction: Intravenous  PONV Risk Score and Plan: 3 and Midazolam, Dexamethasone and Ondansetron  Airway Management Planned: Oral ETT  Additional Equipment:   Intra-op Plan:   Post-operative Plan: Extubation in OR  Informed Consent: I have reviewed the patients History and Physical, chart, labs and discussed the procedure including the risks, benefits and alternatives for the proposed anesthesia with the patient or authorized representative who has indicated his/her understanding and acceptance.     Dental advisory given  Plan Discussed with: CRNA  Anesthesia Plan Comments:         Anesthesia Quick Evaluation  

## 2019-04-26 NOTE — Anesthesia Postprocedure Evaluation (Signed)
Anesthesia Post Note  Patient: Carolyn Mclaughlin  Procedure(s) Performed: LAPRASCOPIC REPAIR OF INCARCERATED EPIGASTRIC HERNIA WITH MESH (N/A )     Patient location during evaluation: PACU Anesthesia Type: General Level of consciousness: awake and alert Pain management: pain level controlled Vital Signs Assessment: post-procedure vital signs reviewed and stable Respiratory status: spontaneous breathing, nonlabored ventilation, respiratory function stable and patient connected to nasal cannula oxygen Cardiovascular status: blood pressure returned to baseline and stable Postop Assessment: no apparent nausea or vomiting Anesthetic complications: no    Last Vitals:  Vitals:   04/26/19 1311 04/26/19 1413  BP: (!) 155/92 (!) 156/102  Pulse: 74 81  Resp: 16 18  Temp: 36.8 C   SpO2: 100% 100%    Last Pain:  Vitals:   04/26/19 1245  TempSrc:   PainSc: 0-No pain                 Carleta Woodrow L Lennie Dunnigan

## 2019-04-26 NOTE — Interval H&P Note (Signed)
History and Physical Interval Note:  04/26/2019 9:12 AM  Carolyn Mclaughlin  has presented today for surgery, with the diagnosis of incarcerated epigastric hernia.  The various methods of treatment have been discussed with the patient and family. After consideration of risks, benefits and other options for treatment, the patient has consented to  Procedure(s): LAPRASCOPIC REPAIR OF INCARCERATED EPIGASTRIC HERNIA WITH MESH (N/A) as a surgical intervention.  The patient's history has been reviewed, patient examined, no change in status, stable for surgery.  I have reviewed the patient's chart and labs.  Questions were answered to the patient's satisfaction.     Ernestene Mention

## 2019-04-27 DIAGNOSIS — K436 Other and unspecified ventral hernia with obstruction, without gangrene: Secondary | ICD-10-CM | POA: Diagnosis not present

## 2019-04-27 MED ORDER — HYDROCODONE-ACETAMINOPHEN 5-325 MG PO TABS: 1.0000 | ORAL_TABLET | ORAL | 0 refills | Status: DC | PRN

## 2019-04-27 NOTE — Plan of Care (Signed)
  Problem: Education: Goal: Knowledge of General Education information will improve Description Including pain rating scale, medication(s)/side effects and non-pharmacologic comfort measures Outcome: Adequate for Discharge   Problem: Health Behavior/Discharge Planning: Goal: Ability to manage health-related needs will improve Outcome: Adequate for Discharge   Problem: Clinical Measurements: Goal: Ability to maintain clinical measurements within normal limits will improve Outcome: Adequate for Discharge Goal: Will remain free from infection Outcome: Adequate for Discharge Goal: Diagnostic test results will improve Outcome: Adequate for Discharge Goal: Respiratory complications will improve Outcome: Adequate for Discharge Goal: Cardiovascular complication will be avoided Outcome: Adequate for Discharge   Problem: Activity: Goal: Risk for activity intolerance will decrease Outcome: Adequate for Discharge   Problem: Nutrition: Goal: Adequate nutrition will be maintained Outcome: Adequate for Discharge   Problem: Coping: Goal: Level of anxiety will decrease Outcome: Adequate for Discharge   Problem: Elimination: Goal: Will not experience complications related to bowel motility Outcome: Adequate for Discharge Goal: Will not experience complications related to urinary retention Outcome: Adequate for Discharge   Problem: Pain Managment: Goal: General experience of comfort will improve Outcome: Adequate for Discharge   Problem: Safety: Goal: Ability to remain free from injury will improve Outcome: Adequate for Discharge   Problem: Skin Integrity: Goal: Risk for impaired skin integrity will decrease Outcome: Adequate for Discharge   Problem: Education: Goal: Required Educational Video(s) Outcome: Adequate for Discharge   Problem: Clinical Measurements: Goal: Postoperative complications will be avoided or minimized Outcome: Adequate for Discharge   Problem: Skin  Integrity: Goal: Demonstration of wound healing without infection will improve Outcome: Adequate for Discharge Home with daughters. Discharge teaching done, written information given.

## 2019-04-27 NOTE — Discharge Summary (Signed)
Patient ID: Carolyn Mclaughlin [id number] 48 y.o. May 08, 1971  Admit date: 04/26/2019  Discharge date and time: 04/27/2019  Admitting Physician: Carolyn Mclaughlin  Discharge Physician: Carolyn Mclaughlin  Admission Diagnoses: incarcerated epigastric hernia  Discharge Diagnoses: Incarcerated epigastric hernia                                         Umbilical hernia                                          BMI 34  Operations: Procedure(s): LAPRASCOPIC REPAIR OF INCARCERATED EPIGASTRIC HERNIA AND UMBILICAL HERNIA  WITH MESH  Admission Condition: good  Discharged Condition: good  Indication for Admission: This is a very pleasant 48 year old woman from [place]. Referred from Dr. Jomarie Mclaughlin at one of the urgent care centers in [place] for a hernia above the umbilicus. Her regular PCP is Dr. Gerarda Mclaughlin.  She has felt a lump in the midline of the abdomen above the umbilicus for about 2 years but did not think much of it. Recently she sneezed and it got larger and very painful and she went to urgent care. This is only partially reducible. Her symptoms improved. No nausea or vomiting. She went home and was referred to surgery. Past history is negative. She's never had any abdominal surgery or C-section. Has no medical problems. Takes no medications. BMI 34 On exam she has a tender, incarcerated, nonreducible mass in the epigastrium about 2 cm above the umbilicus. The size of the hernia sac is probably 6 cm in diameter but the defect feels much smaller the skin looks healthy. There is no inflammation.     CT shows small umbilical hernia and larger hernia in midline 3.6 cm. cephalad to umbilicus. No bowel involved. Should be amenable to repair with one piece of mesh so long as reducible laparoscopically She'll be scheduled for laparoscopic repair of incarcerated epigastric hernia with mesh. She knows this may have to be converted to an open operation. She agrees with  this plan.  Hospital Course: On the day of admission the patient was taken to the operating room and underwent laparoscopic repair of incarcerated epigastric hernia and umbilical hernia with underlay mesh.  We had to dissect incarcerated omentum out of the hernia sac but this was uneventful.  Postoperatively we decided to keep her overnight for observation.  The following morning she was doing well and felt ready to go home.  She was ambulating in her room, voiding without difficulty, tolerating diet.  Abdominal exam reveals her abdominal wounds look good.  Appropriate tenderness but not distended.  She met all discharge criteria for safe discharge home     We gave her instructions in the use of ibuprofen and Tylenol for pain and we called in a prescription for Norco 5-3 25 to her pharmacy if needed.  Diet and activities were discussed and documented.  Follow-up with me in 3 weeks was advised.  No sports or heavy lifting for 30 days.  Consults: None  Significant Diagnostic Studies: none  Treatments: surgery: *  Disposition: Home  Patient Instructions:  Allergies as of 04/27/2019   No Known Allergies     Medication List    TAKE these medications   APPLE CIDER VINEGAR PO Take 2 tablets by mouth daily.  Goli Gummy   HYDROcodone-acetaminophen 5-325 MG tablet Commonly known as:  NORCO/VICODIN Take 1-2 tablets by mouth every 4 (four) hours as needed for moderate pain.   ibuprofen 200 MG tablet Commonly known as:  ADVIL Take 200-400 mg by mouth every 8 (eight) hours as needed (pain.).   nabumetone 750 MG tablet Commonly known as:  RELAFEN Take 750 mg by mouth 2 (two) times daily as needed for pain.            Discharge Care Instructions  (From admission, onward)         Start     Ordered   04/27/19 0000  Discharge wound care:    Comments:  No specific wound care is required There are no sutures that need to be removed The clear plastic superglue will wear off in 3 weeks or  less You may shower  Wear the elastic abdominal binder if it makes you comfortable but it is not required   04/27/19 0746          Activity: no heavy lifting for 4 weeks Diet: low fat, low cholesterol diet Wound Care: none needed  Follow-up:  With Dr. Dalbert Mclaughlin in 3 weeks    Addendum: I logged onto the PMP aware website and reviewed her prescription medication history  .  Signed: Edsel Mclaughlin. Carolyn Mclaughlin, M.D., FACS General and minimally invasive surgery Breast and Colorectal Surgery  04/27/2019, 7:47 AM

## 2019-05-05 ENCOUNTER — Encounter (HOSPITAL_COMMUNITY): Payer: Self-pay | Admitting: General Surgery

## 2019-05-09 ENCOUNTER — Inpatient Hospital Stay (HOSPITAL_COMMUNITY)
Admission: EM | Admit: 2019-05-09 | Discharge: 2019-05-13 | DRG: 041 | Disposition: A | Discharge status: Home / Self Care | Payer: BC Managed Care – PPO | Attending: Internal Medicine | Admitting: Internal Medicine

## 2019-05-09 ENCOUNTER — Encounter (HOSPITAL_COMMUNITY): Payer: Self-pay | Admitting: *Deleted

## 2019-05-09 ENCOUNTER — Ambulatory Visit (HOSPITAL_COMMUNITY)
Admission: EM | Admit: 2019-05-09 | Discharge: 2019-05-09 | Disposition: A | Discharge status: Home / Self Care | Payer: BC Managed Care – PPO | Attending: Family Medicine | Admitting: Family Medicine

## 2019-05-09 ENCOUNTER — Other Ambulatory Visit: Payer: Self-pay

## 2019-05-09 ENCOUNTER — Encounter (HOSPITAL_COMMUNITY): Payer: Self-pay

## 2019-05-09 ENCOUNTER — Emergency Department (HOSPITAL_COMMUNITY): Payer: BC Managed Care – PPO

## 2019-05-09 DIAGNOSIS — Z6833 Body mass index (BMI) 33.0-33.9, adult: Secondary | ICD-10-CM

## 2019-05-09 DIAGNOSIS — R413 Other amnesia: Secondary | ICD-10-CM | POA: Diagnosis present

## 2019-05-09 DIAGNOSIS — R402252 Coma scale, best verbal response, oriented, at arrival to emergency department: Secondary | ICD-10-CM | POA: Diagnosis present

## 2019-05-09 DIAGNOSIS — I639 Cerebral infarction, unspecified: Secondary | ICD-10-CM | POA: Diagnosis not present

## 2019-05-09 DIAGNOSIS — Z823 Family history of stroke: Secondary | ICD-10-CM

## 2019-05-09 DIAGNOSIS — R748 Abnormal levels of other serum enzymes: Secondary | ICD-10-CM | POA: Diagnosis present

## 2019-05-09 DIAGNOSIS — R4701 Aphasia: Secondary | ICD-10-CM | POA: Diagnosis present

## 2019-05-09 DIAGNOSIS — R402142 Coma scale, eyes open, spontaneous, at arrival to emergency department: Secondary | ICD-10-CM | POA: Diagnosis present

## 2019-05-09 DIAGNOSIS — R768 Other specified abnormal immunological findings in serum: Secondary | ICD-10-CM

## 2019-05-09 DIAGNOSIS — Z79899 Other long term (current) drug therapy: Secondary | ICD-10-CM

## 2019-05-09 DIAGNOSIS — R402362 Coma scale, best motor response, obeys commands, at arrival to emergency department: Secondary | ICD-10-CM | POA: Diagnosis present

## 2019-05-09 DIAGNOSIS — Z1159 Encounter for screening for other viral diseases: Secondary | ICD-10-CM

## 2019-05-09 DIAGNOSIS — E669 Obesity, unspecified: Secondary | ICD-10-CM | POA: Diagnosis present

## 2019-05-09 DIAGNOSIS — Q2112 Patent foramen ovale: Secondary | ICD-10-CM

## 2019-05-09 DIAGNOSIS — R29700 NIHSS score 0: Secondary | ICD-10-CM | POA: Diagnosis present

## 2019-05-09 DIAGNOSIS — E785 Hyperlipidemia, unspecified: Secondary | ICD-10-CM | POA: Diagnosis present

## 2019-05-09 DIAGNOSIS — Q211 Atrial septal defect: Secondary | ICD-10-CM

## 2019-05-09 DIAGNOSIS — I739 Peripheral vascular disease, unspecified: Secondary | ICD-10-CM | POA: Diagnosis present

## 2019-05-09 LAB — DIFFERENTIAL
Abs Immature Granulocytes: 0.02 10*3/uL (ref 0.00–0.07)
Basophils Absolute: 0 10*3/uL (ref 0.0–0.1)
Basophils Relative: 1 %
Eosinophils Absolute: 0.2 10*3/uL (ref 0.0–0.5)
Eosinophils Relative: 3 %
Immature Granulocytes: 0 %
Lymphocytes Relative: 30 %
Lymphs Abs: 1.9 10*3/uL (ref 0.7–4.0)
Monocytes Absolute: 0.5 10*3/uL (ref 0.1–1.0)
Monocytes Relative: 9 %
Neutro Abs: 3.7 10*3/uL (ref 1.7–7.7)
Neutrophils Relative %: 57 %

## 2019-05-09 LAB — CBC
HCT: 37.2 % (ref 36.0–46.0)
Hemoglobin: 12.1 g/dL (ref 12.0–15.0)
MCH: 30 pg (ref 26.0–34.0)
MCHC: 32.5 g/dL (ref 30.0–36.0)
MCV: 92.1 fL (ref 80.0–100.0)
Platelets: 356 10*3/uL (ref 150–400)
RBC: 4.04 MIL/uL (ref 3.87–5.11)
RDW: 11.8 % (ref 11.5–15.5)
WBC: 6.4 10*3/uL (ref 4.0–10.5)
nRBC: 0 % (ref 0.0–0.2)

## 2019-05-09 LAB — COMPREHENSIVE METABOLIC PANEL
ALT: 51 U/L — ABNORMAL HIGH (ref 0–44)
AST: 22 U/L (ref 15–41)
Albumin: 3.7 g/dL (ref 3.5–5.0)
Alkaline Phosphatase: 158 U/L — ABNORMAL HIGH (ref 38–126)
Anion gap: 11 (ref 5–15)
BUN: 8 mg/dL (ref 6–20)
CO2: 25 mmol/L (ref 22–32)
Calcium: 9.4 mg/dL (ref 8.9–10.3)
Chloride: 102 mmol/L (ref 98–111)
Creatinine, Ser: 0.85 mg/dL (ref 0.44–1.00)
GFR calc Af Amer: >60 mL/min (ref >60)
GFR calc non Af Amer: >60 mL/min (ref >60)
Glucose, Bld: 105 mg/dL — ABNORMAL HIGH (ref 70–99)
Potassium: 3.5 mmol/L (ref 3.5–5.1)
Sodium: 138 mmol/L (ref 135–145)
Total Bilirubin: 0.5 mg/dL (ref 0.3–1.2)
Total Protein: 7.4 g/dL (ref 6.5–8.1)

## 2019-05-09 LAB — I-STAT CHEM 8, ED
BUN: 10 mg/dL (ref 6–20)
Calcium, Ion: 1.19 mmol/L (ref 1.15–1.40)
Chloride: 102 mmol/L (ref 98–111)
Creatinine, Ser: 0.8 mg/dL (ref 0.44–1.00)
Glucose, Bld: 102 mg/dL — ABNORMAL HIGH (ref 70–99)
HCT: 36 % (ref 36.0–46.0)
Hemoglobin: 12.2 g/dL (ref 12.0–15.0)
Potassium: 3.6 mmol/L (ref 3.5–5.1)
Sodium: 137 mmol/L (ref 135–145)
TCO2: 28 mmol/L (ref 22–32)

## 2019-05-09 LAB — CBG MONITORING, ED: Glucose-Capillary: 104 mg/dL — ABNORMAL HIGH (ref 70–99)

## 2019-05-09 LAB — I-STAT BETA HCG BLOOD, ED (MC, WL, AP ONLY): I-stat hCG, quantitative: <5 m[IU]/mL (ref <5)

## 2019-05-09 LAB — APTT: aPTT: 33 seconds (ref 24–36)

## 2019-05-09 LAB — PROTIME-INR
INR: 1 (ref 0.8–1.2)
Prothrombin Time: 12.9 seconds (ref 11.4–15.2)

## 2019-05-09 MED ORDER — SODIUM CHLORIDE 0.9% FLUSH
3.0000 mL | Freq: Once | INTRAVENOUS | Status: AC
Start: 2019-05-09 — End: 2019-05-10
  Administered 2019-05-10: 3 mL via INTRAVENOUS

## 2019-05-09 NOTE — ED Provider Notes (Signed)
[facility]   [id number] 05/09/19 Arrival Time: 3710  ASSESSMENT & PLAN:  1. Intermittent memory loss    She declines ED evaluation at this time. Prefers to call and discuss with PCP first. I spoke with patient's daughter and recommended prompt ED evaluation. Certainly there is concern for CVA with lingering sequelae. She will take her mother to the ED now. Stable upon discharge.  Reviewed expectations re: course of current medical issues. Questions answered. Outlined signs and symptoms indicating need for more acute intervention. Patient verbalized understanding. After Visit Summary given.   SUBJECTIVE: Difficulty history.  Carolyn Mclaughlin is a 48 y.o. female who reports intermittent/transiet memory loss first noticed two weeks ago after having surgery for hernia repair. "Just cannot remember things well." No headaches or visual changes. No ataxia or aphasia. Memory loss is transient. Ambulatory without difficulty. No recent illnesses. Afebrile. No new medications. No drug or significant alcohol use. Overall feels memory loss problem has not worsened. She is unable to further elaborate. No head injury/falls. No specific aggravating or alleviating factors reported.  Later a partial history obtained from patient's daughter. She also reports her mother seems confused at times.  Social History   Tobacco Use  Smoking Status Never Smoker  Smokeless Tobacco Never Used   ROS: As per HPI. No specific aggravating or alleviating factors reported..  OBJECTIVE:  Vitals:   05/09/19 1232  BP: (!) 159/94  Pulse: 93  Resp: 16  Temp: 98.1 F (36.7 C)  TempSrc: Oral  SpO2: 100%    General appearance: alert; no distress Eyes: PERRLA; EOMI; conjunctiva normal HENT: normocephalic; atraumatic Neck: supple with FROM Lungs: clear to auscultation bilaterally Heart: regular rate and rhythm Abdomen: soft, non-tender; bowel sounds normal Extremities: no cyanosis or edema;  symmetrical with no gross deformities Skin: warm and dry Neurologic: normal gait; DTR's normal and symmetrical; CN appear 2-12 grossly intact; she does have trouble remembering who the current president it and has trouble recalling her address; speak clearly without word slurring; difficulty with word finding Psychological: alert and cooperative; normal mood and affect  No Known Allergies  Past Medical History:  Diagnosis Date  . Headache   . Incarcerated epigastric hernia 04/26/2019  . Umbilical hernia 05/26/9484   Social History   Socioeconomic History  . Marital status: Single    Spouse name: Not on file  . Number of children: Not on file  . Years of education: Not on file  . Highest education level: Not on file  Occupational History  . Not on file  Social Needs  . Financial resource strain: Not on file  . Food insecurity:    Worry: Not on file    Inability: Not on file  . Transportation needs:    Medical: Not on file    Non-medical: Not on file  Tobacco Use  . Smoking status: Never Smoker  . Smokeless tobacco: Never Used  Substance and Sexual Activity  . Alcohol use: Yes    Comment: socially-wine  . Drug use: Never  . Sexual activity: Not on file  Lifestyle  . Physical activity:    Days per week: Not on file    Minutes per session: Not on file  . Stress: Not on file  Relationships  . Social connections:    Talks on phone: Not on file    Gets together: Not on file    Attends religious service: Not on file    Active member of club or organization: Not on file  Attends meetings of clubs or organizations: Not on file    Relationship status: Not on file  . Intimate partner violence:    Fear of current or ex partner: Not on file    Emotionally abused: Not on file    Physically abused: Not on file    Forced sexual activity: Not on file  Other Topics Concern  . Not on file  Social History Narrative  . Not on file   Family History  Family history unknown: Yes    Past Surgical History:  Procedure Laterality Date  . NO PAST SURGERIES    . VENTRAL HERNIA REPAIR N/A 04/26/2019   Procedure: LAPRASCOPIC REPAIR OF INCARCERATED EPIGASTRIC HERNIA WITH MESH;  Surgeon: Carolyn Mclaughlin, Haywood, MD;  Location: [facility];  Service: General;  Laterality: Vertis KelchN/A;      Carolyn Streett, MD 05/11/19 [phone]

## 2019-05-09 NOTE — ED Triage Notes (Signed)
Patient presents to Urgent Care with complaints of not being able to remember anything since she had surgery approx 2 weeks ago. Patient reports she knows what she has been doing, but cannot remember some long-term memory items like banking information or medications she has been taking chronically.

## 2019-05-09 NOTE — ED Triage Notes (Signed)
Pt in with daughter reporting memory loss in patient for the last few days, pt answering questions appropriately in triage, went to urgent care prior to here and was not answering orientation questions appropriately, denies weakness

## 2019-05-09 NOTE — ED Provider Notes (Signed)
[facility] Note   CSN: [id number] Arrival date & time: 05/09/19  1532    History   Chief Complaint Chief Complaint  Patient presents with  . Memory Loss    HPI Carolyn Mclaughlin is a 48 y.o. female who presents to the emergency department with a chief complaint of memory loss.  The patient's daughter reports worsening difficulty with word finding for the last 5 days.  She reports that she also received a text from her mother 5 days ago trying to ask if she had started watching a new tv show, but the text message was confusing and didn't make sense so she called her to follow up. She she reports symptoms worsen today.  She reports that she was having lunch with the patient when she ordered food from the menu and by the time the food arrived she had forgotten what she had ordered.   Per chart review, the patient reports that she underwent surgery for an incarcerated epigastric hernia on 5/25.  She is unable to tell me the name or the type of surgery that she recently underwent.  She has had no complications related to the surgery.  She has no visual changes, numbness, weakness, ataxia, dizziness, syncope, seizure-like activity, leg pain or swelling, chest pain, shortness of breath, dysarthria.  No history of hypertension or hyperlipidemia.  She is a non-smoker.     The history is provided by the patient. No language interpreter was used.    Past Medical History:  Diagnosis Date  . Headache   . Incarcerated epigastric hernia 04/26/2019  . Umbilical hernia 04/26/2019    Patient Active Problem List   Diagnosis Date Noted  . Acute ischemic stroke (HCC) 05/10/2019  . Incarcerated epigastric hernia 04/26/2019  . Umbilical hernia 04/26/2019    Past Surgical History:  Procedure Laterality Date  . NO PAST SURGERIES    . VENTRAL HERNIA REPAIR N/A 04/26/2019   Procedure: LAPRASCOPIC REPAIR OF INCARCERATED EPIGASTRIC HERNIA WITH MESH;   Surgeon: Claud KelpIngram, Haywood, MD;  Location: [facility];  Service: General;  Laterality: N/A;     OB History   No obstetric history on file.      Home Medications    Prior to Admission medications   Medication Sig Start Date End Date Taking? Authorizing Provider  ibuprofen (ADVIL) 200 MG tablet Take 200-400 mg by mouth every 8 (eight) hours as needed (pain.).   Yes [provider]  nabumetone (RELAFEN) 750 MG tablet Take 750 mg by mouth 2 (two) times daily as needed for pain. 04/08/19  Yes [provider]    Family History Family History  Family history unknown: Yes    Social History Social History   Tobacco Use  . Smoking status: Never Smoker  . Smokeless tobacco: Never Used  Substance Use Topics  . Alcohol use: Yes    Comment: socially-wine  . Drug use: Never     Allergies   Patient has no known allergies.   Review of Systems Review of Systems  Constitutional: Negative for activity change, chills and fever.  Eyes: Negative for photophobia and visual disturbance.  Respiratory: Negative for shortness of breath.   Cardiovascular: Negative for chest pain, palpitations and leg swelling.  Gastrointestinal: Negative for abdominal pain, diarrhea, nausea and vomiting.  Genitourinary: Negative for dysuria.  Musculoskeletal: Negative for back pain and myalgias.  Skin: Negative for rash.  Allergic/Immunologic: Negative for immunocompromised state.  Neurological: Positive for speech difficulty. Negative for  dizziness, weakness, light-headedness, numbness and headaches.  Psychiatric/Behavioral: Negative for confusion.     Physical Exam Updated Vital Signs BP (!) 169/103   Pulse 80   Temp 98.1 F (36.7 C) (Oral)   Resp 12   SpO2 98%   Physical Exam Vitals signs and nursing note reviewed.  Constitutional:      General: She is not in acute distress.    Appearance: She is not ill-appearing, toxic-appearing or diaphoretic.  HENT:     Head: Normocephalic.   Eyes:     Extraocular Movements: Extraocular movements intact.     Conjunctiva/sclera: Conjunctivae normal.     Pupils: Pupils are equal, round, and reactive to light.  Neck:     Musculoskeletal: Neck supple.  Cardiovascular:     Rate and Rhythm: Normal rate and regular rhythm.     Heart sounds: No murmur. No friction rub. No gallop.   Pulmonary:     Effort: Pulmonary effort is normal. No respiratory distress.     Breath sounds: No stridor. No wheezing, rhonchi or rales.  Chest:     Chest wall: No tenderness.  Abdominal:     General: There is no distension.     Palpations: Abdomen is soft. There is no mass.     Tenderness: There is no abdominal tenderness. There is no right CVA tenderness, left CVA tenderness, guarding or rebound.     Hernia: No hernia is present.     Comments: Well-healed surgical scars noted to the abdomen.  Dermabond is in place.  There is no surrounding redness or warmth.  No drainage.  Musculoskeletal:     Right lower leg: No edema.     Left lower leg: No edema.     Comments: No pain, redness, or swelling to the bilateral lower extremities.  Skin:    General: Skin is warm.     Findings: No rash.  Neurological:     Mental Status: She is alert.     Comments: Mental Status: Alert, oriented, thought content appropriate. Speech fluent.  +Aphasia; difficulty with word finding. No dysarthria. Able to follow 2 step commands without difficulty.  Cranial Nerves:  II:  Peripheral visual fields grossly normal, pupils equal, round, reactive to light III,IV, VI: ptosis not present, extra-ocular motions intact bilaterally  V,VII: smile symmetric, facial light touch sensation equal VIII: hearing grossly normal to voice  X: uvula elevates symmetrically  XI: bilateral shoulder shrug symmetric and strong XII: midline tongue extension without fassiculations Motor:  Normal tone. 5/5 in upper and lower extremities bilaterally including strong and equal grip strength and  dorsiflexion/plantar flexion Sensory: Pinprick and light touch normal in all extremities.  Deep Tendon Reflexes: 2+ and symmetric in the biceps and patella Cerebellar: normal finger-to-nose with bilateral upper extremities Gait: normal gait and balance CV: distal pulses palpable throughout   Psychiatric:        Behavior: Behavior normal.      ED Treatments / Results  Labs (all labs ordered are listed, but only abnormal results are displayed) Labs Reviewed  COMPREHENSIVE METABOLIC PANEL - Abnormal; Notable for the following components:      Result Value   Glucose, Bld 105 (*)    ALT 51 (*)    Alkaline Phosphatase 158 (*)    All other components within normal limits  I-STAT CHEM 8, ED - Abnormal; Notable for the following components:   Glucose, Bld 102 (*)    All other components within normal limits  CBG MONITORING, ED -  Abnormal; Notable for the following components:   Glucose-Capillary 104 (*)    All other components within normal limits  SARS CORONAVIRUS 2 (HOSPITAL ORDER, [place] [facility])  PROTIME-INR  APTT  CBC  DIFFERENTIAL  HIV ANTIBODY (ROUTINE TESTING W REFLEX)  HEMOGLOBIN A1C  LIPID PANEL  I-STAT BETA HCG BLOOD, ED ([facility], [facility], [facility] ONLY)    EKG EKG Interpretation  Date/Time:  Monday May 09 2019 15:58:29 EDT Ventricular Rate:  92 PR Interval:  172 QRS Duration: 88 QT Interval:  382 QTC Calculation: 472 R Axis:   15 Text Interpretation:  Normal sinus rhythm Possible Anterior infarct , age undetermined Abnormal ECG No previous ECGs available Confirmed by Ripley Fraise [id number]) on 05/09/2019 11:07:46 PM   Radiology Ct Head Wo Contrast  Result Date: 05/09/2019 CLINICAL DATA:  Pt in with daughter reporting memory loss in patient for the last few days EXAM: CT HEAD WITHOUT CONTRAST TECHNIQUE: Contiguous axial images were obtained from the base of the skull through the vertex without intravenous contrast. COMPARISON:  None. FINDINGS: Brain:  Hypoattenuation in the left basal ganglia and thalamus, possibly subacute infarct. No acute hemorrhage. No hydrocephalus, extra-axial collection, mass lesion, or mass effect. Vascular: No hyperdense vessel or unexpected calcification. Skull: Normal. Negative for fracture or focal lesion. Sinuses/Orbits: No acute finding. Other: None IMPRESSION: Hypoattenuation in the left basal ganglia and thalamus, possibly subacute infarct. No acute hemorrhage. Electronically Signed   By: Lucrezia Europe M.D.   On: 05/09/2019 18:57    Procedures Procedures (including critical care time)  Medications Ordered in ED Medications  LORazepam (ATIVAN) injection 1 mg (has no administration in time range)   stroke: mapping our early stages of recovery book (has no administration in time range)  acetaminophen (TYLENOL) tablet 650 mg (has no administration in time range)    Or  acetaminophen (TYLENOL) solution 650 mg (has no administration in time range)    Or  acetaminophen (TYLENOL) suppository 650 mg (has no administration in time range)  enoxaparin (LOVENOX) injection 40 mg (has no administration in time range)  sodium chloride flush (NS) 0.9 % injection 3 mL (3 mLs Intravenous Given 05/10/19 0027)     Initial Impression / Assessment and Plan / ED Course  I have reviewed the triage vital signs and the nursing notes.  Pertinent labs & imaging results that were available during my care of the patient were reviewed by me and considered in my medical decision making (see chart for details).        48 year old healthy female with a recent laparoscopic surgery secondary to incarcerated epigastric hernia presenting with aphasia for the last 5 days.  She has been having difficulty with word finding.  No other focal neurologic deficits and neuro exam is otherwise unremarkable.  She has mildly hypertensive, but otherwise hemodynamically stable in the ER today.   Labs are reassuring.  EKG with sinus rhythm and possible  anterior infarct.  Will add on troponin.  Patient denies chest pain or shortness of breath at this time. CT head with hypoattenuation in the left basal ganglia and thalamus, possibly subacute infarct.  Consulted neurology and spoke with Dr. Cheral Marker who recommends admission.  Neurology will follow the patient. Consult note with additional recommendations.  Plan was discussed the patient and her daughter who are agreeable at this time.  Dr. Alcario Drought with the hospitalist team will accept the patient for admission. The patient appears reasonably stabilized for admission considering the current resources, flow, and capabilities  available in the ED at this time, and I doubt [facility] screening and/or treatment in the ED prior to admission.  Final Clinical Impressions(s) / ED Diagnoses   Final diagnoses:  Aphasia    ED Discharge Orders    None       Barkley BoardsMcDonald, Mia A, PA-C 05/10/19 0108    Zadie RhineWickline, Donald, MD 05/10/19 [phone]

## 2019-05-10 ENCOUNTER — Observation Stay (HOSPITAL_BASED_OUTPATIENT_CLINIC_OR_DEPARTMENT_OTHER): Payer: BC Managed Care – PPO

## 2019-05-10 ENCOUNTER — Observation Stay (HOSPITAL_COMMUNITY): Payer: BC Managed Care – PPO

## 2019-05-10 DIAGNOSIS — I639 Cerebral infarction, unspecified: Secondary | ICD-10-CM | POA: Diagnosis present

## 2019-05-10 DIAGNOSIS — Q211 Atrial septal defect: Secondary | ICD-10-CM | POA: Diagnosis not present

## 2019-05-10 DIAGNOSIS — R402362 Coma scale, best motor response, obeys commands, at arrival to emergency department: Secondary | ICD-10-CM | POA: Diagnosis present

## 2019-05-10 DIAGNOSIS — Z823 Family history of stroke: Secondary | ICD-10-CM | POA: Diagnosis not present

## 2019-05-10 DIAGNOSIS — Z79899 Other long term (current) drug therapy: Secondary | ICD-10-CM | POA: Diagnosis not present

## 2019-05-10 DIAGNOSIS — R4701 Aphasia: Secondary | ICD-10-CM | POA: Diagnosis present

## 2019-05-10 DIAGNOSIS — I6389 Other cerebral infarction: Secondary | ICD-10-CM | POA: Diagnosis not present

## 2019-05-10 DIAGNOSIS — I739 Peripheral vascular disease, unspecified: Secondary | ICD-10-CM | POA: Diagnosis present

## 2019-05-10 DIAGNOSIS — R413 Other amnesia: Secondary | ICD-10-CM

## 2019-05-10 DIAGNOSIS — E785 Hyperlipidemia, unspecified: Secondary | ICD-10-CM | POA: Diagnosis present

## 2019-05-10 DIAGNOSIS — R748 Abnormal levels of other serum enzymes: Secondary | ICD-10-CM | POA: Diagnosis present

## 2019-05-10 DIAGNOSIS — E669 Obesity, unspecified: Secondary | ICD-10-CM | POA: Diagnosis present

## 2019-05-10 DIAGNOSIS — R402142 Coma scale, eyes open, spontaneous, at arrival to emergency department: Secondary | ICD-10-CM | POA: Diagnosis present

## 2019-05-10 DIAGNOSIS — Z6833 Body mass index (BMI) 33.0-33.9, adult: Secondary | ICD-10-CM | POA: Diagnosis not present

## 2019-05-10 DIAGNOSIS — R402252 Coma scale, best verbal response, oriented, at arrival to emergency department: Secondary | ICD-10-CM | POA: Diagnosis present

## 2019-05-10 DIAGNOSIS — Z1159 Encounter for screening for other viral diseases: Secondary | ICD-10-CM | POA: Diagnosis not present

## 2019-05-10 DIAGNOSIS — R29700 NIHSS score 0: Secondary | ICD-10-CM | POA: Diagnosis present

## 2019-05-10 HISTORY — DX: Cerebral infarction, unspecified: I63.9

## 2019-05-10 LAB — RAPID URINE DRUG SCREEN, HOSP PERFORMED
Amphetamines: NOT DETECTED
Barbiturates: NOT DETECTED
Benzodiazepines: NOT DETECTED
Cocaine: NOT DETECTED
Opiates: NOT DETECTED
Tetrahydrocannabinol: NOT DETECTED

## 2019-05-10 LAB — HIV ANTIBODY (ROUTINE TESTING W REFLEX): HIV Screen 4th Generation wRfx: NONREACTIVE

## 2019-05-10 LAB — HEMOGLOBIN A1C
Hgb A1c MFr Bld: 5.8 % — ABNORMAL HIGH (ref 4.8–5.6)
Mean Plasma Glucose: 119.76 mg/dL

## 2019-05-10 LAB — LIPID PANEL
Cholesterol: 173 mg/dL (ref 0–200)
HDL: 46 mg/dL (ref >40)
LDL Cholesterol: 117 mg/dL — ABNORMAL HIGH (ref 0–99)
Total CHOL/HDL Ratio: 3.8 RATIO
Triglycerides: 52 mg/dL (ref <150)
VLDL: 10 mg/dL (ref 0–40)

## 2019-05-10 LAB — TROPONIN I: Troponin I: <0.03 ng/mL (ref <0.03)

## 2019-05-10 LAB — ECHOCARDIOGRAM COMPLETE
Height: 62 in
Weight: 2931.24 oz

## 2019-05-10 LAB — SARS CORONAVIRUS 2 BY RT PCR (HOSPITAL ORDER, PERFORMED IN ~~LOC~~ HOSPITAL LAB): SARS Coronavirus 2: NEGATIVE

## 2019-05-10 MED ORDER — ACETAMINOPHEN 650 MG RE SUPP: 650.0000 mg | RECTAL | Status: DC | PRN

## 2019-05-10 MED ORDER — LORAZEPAM 2 MG/ML IJ SOLN: 1.0000 mg | Freq: Once | INTRAMUSCULAR | Status: DC | PRN

## 2019-05-10 MED ORDER — ASPIRIN 81 MG PO CHEW
81.0000 mg | CHEWABLE_TABLET | Freq: Every day | ORAL | Status: DC
  Administered 2019-05-10 – 2019-05-13 (×4): 81 mg via ORAL
  Filled 2019-05-10 (×4): qty 1

## 2019-05-10 MED ORDER — ACETAMINOPHEN 325 MG PO TABS: 650.0000 mg | ORAL_TABLET | ORAL | Status: DC | PRN

## 2019-05-10 MED ORDER — ENOXAPARIN SODIUM 40 MG/0.4ML ~~LOC~~ SOLN
40.0000 mg | SUBCUTANEOUS | Status: DC
  Administered 2019-05-10 – 2019-05-13 (×4): 40 mg via SUBCUTANEOUS
  Filled 2019-05-10 (×4): qty 0.4

## 2019-05-10 MED ORDER — ACETAMINOPHEN 160 MG/5ML PO SOLN: 650.0000 mg | ORAL | Status: DC | PRN

## 2019-05-10 MED ORDER — STROKE: EARLY STAGES OF RECOVERY BOOK
Freq: Once | Status: AC
  Administered 2019-05-10: 06:00:00

## 2019-05-10 MED ORDER — CLOPIDOGREL BISULFATE 75 MG PO TABS
75.0000 mg | ORAL_TABLET | Freq: Every day | ORAL | Status: DC
  Administered 2019-05-10 – 2019-05-13 (×4): 75 mg via ORAL
  Filled 2019-05-10 (×4): qty 1

## 2019-05-10 MED ORDER — ATORVASTATIN CALCIUM 80 MG PO TABS
80.0000 mg | ORAL_TABLET | Freq: Every day | ORAL | Status: DC
  Administered 2019-05-10 – 2019-05-12 (×3): 80 mg via ORAL
  Filled 2019-05-10 (×3): qty 1

## 2019-05-10 NOTE — Progress Notes (Signed)
STROKE TEAM PROGRESS NOTE   INTERVAL HISTORY I have personally reviewed history of presenting illness with the patient as well as imaging films in PACS.  Carolyn Mclaughlin denies any focal stroke symptoms except some memory difficulties.  Vitals:   05/10/19 0510 05/10/19 0554 05/10/19 0755 05/10/19 1159  BP: (!) 138/91 130/82 (!) 141/96 (!) 148/96  Pulse: 78 85 81 86  Resp: _0 Temp:  98.7 F (37.1 C) 98.4 F (36.9 C) 98.7 F (37.1 C)  TempSrc:  Oral Oral Oral  SpO2: 98% 100% 100% 100%  Weight:  83.1 kg    Height:  5' 2" (1.575 m)      CBC:  Recent Labs  Lab 05/09/19 1611 05/09/19 1630  WBC 6.4  --   NEUTROABS 3.7  --   HGB 12.1 12.2  HCT 37.2 36.0  MCV 92.1  --   PLT 356  --     Basic Metabolic Panel:  Recent Labs  Lab 05/09/19 1611 05/09/19 1630  NA 138 137  K 3.5 3.6  CL 102 102  CO2 25  --   GLUCOSE 105* 102*  BUN 8 10  CREATININE 0.85 0.80  CALCIUM 9.4  --    Lipid Panel:     Component Value Date/Time   CHOL 173 05/10/2019 0341   TRIG 52 05/10/2019 0341   HDL 46 05/10/2019 0341   CHOLHDL 3.8 05/10/2019 0341   VLDL 10 05/10/2019 0341   LDLCALC 117 (H) 05/10/2019 0341   HgbA1c:  Lab Results  Component Value Date   HGBA1C 5.8 (H) 05/10/2019   Urine Drug Screen:     Component Value Date/Time   LABOPIA NONE DETECTED 05/10/2019 0837   COCAINSCRNUR NONE DETECTED 05/10/2019 0837   LABBENZ NONE DETECTED 05/10/2019 0837   AMPHETMU NONE DETECTED 05/10/2019 0837   THCU NONE DETECTED 05/10/2019 0837   LABBARB NONE DETECTED 05/10/2019 0837    Alcohol Level No results found for: [facility]  IMAGING Dg Chest 2 View  Result Date: 05/10/2019 CLINICAL DATA:  Initial evaluation for acute stroke. EXAM: CHEST - 2 VIEW COMPARISON:  Prior radiograph 02/18/2012. FINDINGS: The cardiac and mediastinal silhouettes are stable in size and contour, and remain within normal limits. The lungs are normally inflated. No airspace consolidation, pleural effusion, or pulmonary edema is  identified. There is no pneumothorax. No acute osseous abnormality identified. IMPRESSION: No active cardiopulmonary disease. Electronically Signed   By: Jeannine Boga M.D.   On: 05/10/2019 01:39   Ct Head Wo Contrast  Result Date: 05/09/2019 CLINICAL DATA:  Pt in with daughter reporting memory loss in patient for the last few days EXAM: CT HEAD WITHOUT CONTRAST TECHNIQUE: Contiguous axial images were obtained from the base of the skull through the vertex without intravenous contrast. COMPARISON:  None. FINDINGS: Brain: Hypoattenuation in the left basal ganglia and thalamus, possibly subacute infarct. No acute hemorrhage. No hydrocephalus, extra-axial collection, mass lesion, or mass effect. Vascular: No hyperdense vessel or unexpected calcification. Skull: Normal. Negative for fracture or focal lesion. Sinuses/Orbits: No acute finding. Other: None IMPRESSION: Hypoattenuation in the left basal ganglia and thalamus, possibly subacute infarct. No acute hemorrhage. Electronically Signed   By: Lucrezia Europe M.D.   On: 05/09/2019 18:57   Mr Virgel Paling GY Contrast  Result Date: 05/10/2019 CLINICAL DATA:  Initial evaluation for acute stroke, subacute stroke on prior CT. EXAM: MRI HEAD WITHOUT CONTRAST MRA HEAD WITHOUT CONTRAST TECHNIQUE: Multiplanar, multiecho pulse sequences of the brain and surrounding structures were obtained  without intravenous contrast. Angiographic images of the head were obtained using MRA technique without contrast. COMPARISON:  Prior CT from 05/09/2019 FINDINGS: MRI HEAD FINDINGS Brain: Cerebral volume within normal limits for age. No significant cerebral white matter changes. 2 cm subacute ischemic nonhemorrhagic infarcts seen involving the ventral medial left thalamus, corresponding with abnormality on prior CT. Associated T2/FLAIR signal abnormality without significant regional mass effect or edema. No other evidence for acute or subacute ischemia. Gray-white matter differentiation  otherwise maintained. No other areas of remote or chronic infarction. No foci of susceptibility artifact to suggest acute or chronic intracranial hemorrhage. No mass lesion, midline shift or mass effect. Ventricles normal size without hydrocephalus. No extra-axial fluid collection. Pituitary gland suprasellar region normal. Midline structures intact and normal. Vascular: Major intracranial vascular flow voids are well maintained. Skull and upper cervical spine: Craniocervical junction normal. Upper cervical spine within normal limits. No focal marrow replacing lesion. No scalp soft tissue abnormality. Sinuses/Orbits: Globes and orbital soft tissues demonstrate no acute finding. Mild axial myopia noted. Paranasal sinuses are largely clear. No mastoid effusion. Inner ear structures normal. Other: None. MRA HEAD FINDINGS ANTERIOR CIRCULATION: Distal cervical segments of the internal carotid arteries are patent with symmetric antegrade flow. Distal cervical right ICA mildly tortuous. Petrous, cavernous, and supraclinoid segments patent without hemodynamically significant stenosis. A1 segments patent bilaterally. Normal anterior communicating artery. Anterior cerebral arteries widely patent to their distal aspects. No M1 stenosis or occlusion. Normal MCA bifurcations. Distal MCA branches well perfused and symmetric. POSTERIOR CIRCULATION: Vertebral arteries largely code dominant and widely patent to the vertebrobasilar junction. Posterior inferior cerebral arteries patent bilaterally. Basilar patent to its distal aspect without stenosis. 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 Superior cerebral arteries patent bilaterally. PCAs supplied via hypoplastic P1 segments as well as prominent bilateral posterior communicating arteries. PCAs well perfused to their distal aspects without stenosis. No intracranial aneurysm or other vascular abnormality. IMPRESSION: MRI HEAD IMPRESSION: 1. 2 cm subacute ischemic  nonhemorrhagic infarct involving the ventromedial left thalamus. 2. Otherwise normal brain MRI for age. MRA HEAD IMPRESSION: Normal intracranial MRA. No large vessel occlusion. No hemodynamically significant or correctable stenosis. Electronically Signed   By: Jeannine Boga M.D.   On: 05/10/2019 03:04   Mr Brain Wo Contrast  Result Date: 05/10/2019 CLINICAL DATA:  Initial evaluation for acute stroke, subacute stroke on prior CT. EXAM: MRI HEAD WITHOUT CONTRAST MRA HEAD WITHOUT CONTRAST TECHNIQUE: Multiplanar, multiecho pulse sequences of the brain and surrounding structures were obtained without intravenous contrast. Angiographic images of the head were obtained using MRA technique without contrast. COMPARISON:  Prior CT from 05/09/2019 FINDINGS: MRI HEAD FINDINGS Brain: Cerebral volume within normal limits for age. No significant cerebral white matter changes. 2 cm subacute ischemic nonhemorrhagic infarcts seen involving the ventral medial left thalamus, corresponding with abnormality on prior CT. Associated T2/FLAIR signal abnormality without significant regional mass effect or edema. No other evidence for acute or subacute ischemia. Gray-white matter differentiation otherwise maintained. No other areas of remote or chronic infarction. No foci of susceptibility artifact to suggest acute or chronic intracranial hemorrhage. No mass lesion, midline shift or mass effect. Ventricles normal size without hydrocephalus. No extra-axial fluid collection. Pituitary gland suprasellar region normal. Midline structures intact and normal. Vascular: Major intracranial vascular flow voids are well maintained. Skull and upper cervical spine: Craniocervical junction normal. Upper cervical spine within normal limits. No focal marrow replacing lesion. No scalp soft tissue abnormality. Sinuses/Orbits: Globes and orbital soft tissues demonstrate no acute finding.  Mild axial myopia noted. Paranasal sinuses are largely clear. No  mastoid effusion. Inner ear structures normal. Other: None. MRA HEAD FINDINGS ANTERIOR CIRCULATION: Distal cervical segments of the internal carotid arteries are patent with symmetric antegrade flow. Distal cervical right ICA mildly tortuous. Petrous, cavernous, and supraclinoid segments patent without hemodynamically significant stenosis. A1 segments patent bilaterally. Normal anterior communicating artery. Anterior cerebral arteries widely patent to their distal aspects. No M1 stenosis or occlusion. Normal MCA bifurcations. Distal MCA branches well perfused and symmetric. POSTERIOR CIRCULATION: Vertebral arteries largely code dominant and widely patent to the vertebrobasilar junction. Posterior inferior cerebral arteries patent bilaterally. Basilar patent to its distal aspect without stenosis. 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 Superior cerebral arteries patent bilaterally. PCAs supplied via hypoplastic P1 segments as well as prominent bilateral posterior communicating arteries. PCAs well perfused to their distal aspects without stenosis. No intracranial aneurysm or other vascular abnormality. IMPRESSION: MRI HEAD IMPRESSION: 1. 2 cm subacute ischemic nonhemorrhagic infarct involving the ventromedial left thalamus. 2. Otherwise normal brain MRI for age. MRA HEAD IMPRESSION: Normal intracranial MRA. No large vessel occlusion. No hemodynamically significant or correctable stenosis. Electronically Signed   By: Jeannine Boga M.D.   On: 05/10/2019 03:04    PHYSICAL EXAM Pleasant middle-aged African-American lady not in distress. . Afebrile. Head is nontraumatic. Neck is supple without bruit.    Cardiac exam no murmur or gallop. Lungs are clear to auscultation. Distal pulses are well felt. Neurological Exam ;  Awake  Alert oriented x 3. Normal speech and language.diminished attention, registration and recall is poor 0/3.  Carolyn Mclaughlin is able to name only 2 animals which walk on 4 legs.  Carolyn Mclaughlin  can follow two-step commands.  Eye movements full without nystagmus.fundi were not visualized. Vision acuity and fields appear normal. Hearing is normal. Palatal movements are normal. Face symmetric. Tongue midline. Normal strength, tone, reflexes and coordination. Normal sensation. Gait deferred.  ASSESSMENT/PLAN Carolyn Mclaughlin is a 48 y.o. female with history of hernia surgery 04/26/2019 presenting with memory dysfunction since Thursday 05/05/2019.   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 No source of embolus   TCD bubble pending  LE doppler pending   LDL 117  HgbA1c 5.8  HIV neg  Hypercoagulable labs (ANA, ESR, RPR, lupus anticoagulant, beta-2 glycoprotein, cardiolipin antibody, sickle cell screen ) pending  Lovenox 40 mg sq daily for VTE prophylaxis  No antithrombotic prior to admission, now on aspirin 81 mg daily. Given mild stroke, recommend aspirin 81 mg and plavix 75 mg daily x 3 weeks, then aspirin alone. Orders adjusted.   Therapy recommendations:  No PT, No OT, OP SLP  Disposition:  pending   Possible undiagnosed chronic hypertension  Home meds:  none  Stable . Permissive hypertension (OK if < 220/120) but gradually normalize in 5-7 days . Long-term BP goal normotensive  Hyperlipidemia  Home meds:  No statin  Now on Lipitor 80  LDL 117, goal < 70  Continue statin at discharge  Other Stroke Risk Factors  ETOH use, advised to drink no more than 1 drink(s) a day  Obesity, Body mass index is 33.51 kg/m., recommend weight loss, diet and exercise as appropriate   Other  Active Problems  Recent hernia surgery 04/26/2019  Hospital day # 0  I have personally obtained history,examined  this patient, reviewed notes, independently viewed imaging studies, participated in medical decision making and plan of care.ROS completed by me personally and pertinent positives fully documented  I have made any additions or clarifications directly to the above note.  Carolyn Mclaughlin presented with memory difficulties and the stroke occurred following hernia surgeries hence would like to consider paradoxical embolism and check lower extremity venous Dopplers for DVT as well as TCD bubble study for PFO.  Aspirin and Plavix for stroke prevention for 3 weeks followed by aspirin alone.  Aggressive risk factor modification.  Long discussion with patient and with Dr. Florene Glen and answered questions.  Greater than 50% time during this 35-minute visit were spent in counseling and coordination of care about her stroke discussion about stroke work-up, prevention and treatment and answering questions  Antony Contras, MD Medical Director [place] Pager: [phone] 05/10/2019 4:24 PM   To contact Stroke Continuity provider, please refer to [email or web address] After hours, contact General Neurology

## 2019-05-10 NOTE — Progress Notes (Signed)
Bilateral carotid duplex completed. Preliminary results in Chart review CV Proc. [place] Rodolph Hagemann,RVS 05/10/2019, 4:45 PM

## 2019-05-10 NOTE — H&P (Signed)
History and Physical    Carolyn AwkwardCynthia M Val EAV:[id number] DOB: 06-12-1971 DOA: 05/09/2019  PCP: [facility], [facility]  Patient coming from: Home  I have personally briefly reviewed patient's [facility]  Chief Complaint: Memory dysfunction  HPI: Carolyn Mclaughlin is a 48 y.o. female with medical history significant of incarcerated epigastric hernia s/p repair x2 weeks ago.  Starting on Thursday (5 days ago) she began to have new memory difficulty.  Symptoms have persisted since onset.  No associated limb weakness, facial droop, vision loss, difficulty walking, aphasia nor dysarthria.   ED Course: CT head appears to show a subacute infarct in the L thalamus.  Neuro has evaluated (consult note in chart) hospitalist asked to admit.   Review of Systems: As per HPI otherwise 10 point review of systems negative.   Past Medical History:  Diagnosis Date  . Headache   . Incarcerated epigastric hernia 04/26/2019  . Umbilical hernia 04/26/2019    Past Surgical History:  Procedure Laterality Date  . NO PAST SURGERIES    . VENTRAL HERNIA REPAIR N/A 04/26/2019   Procedure: LAPRASCOPIC REPAIR OF INCARCERATED EPIGASTRIC HERNIA WITH MESH;  Surgeon: Claud KelpIngram, Haywood, MD;  Location: [facility] ORS;  Service: General;  Laterality: N/A;     reports that she has never smoked. She has never used smokeless tobacco. She reports current alcohol use. She reports that she does not use drugs.  No Known Allergies  Family History  Family history unknown: Yes     Prior to Admission medications   Medication Sig Start Date End Date Taking? Authorizing Provider  ibuprofen (ADVIL) 200 MG tablet Take 200-400 mg by mouth every 8 (eight) hours as needed (pain.).   Yes [provider]  nabumetone (RELAFEN) 750 MG tablet Take 750 mg by mouth 2 (two) times daily as needed for pain. 04/08/19  Yes [provider]    Physical Exam: Vitals:   05/09/19 1559 05/09/19  2236 05/10/19 0027 05/10/19 0028  BP: (!) 156/104 (!) 173/99  (!) 169/103  Pulse: 93 78 82 80  Resp: 19 15 19 12   Temp: 98.2 F (36.8 C) 98.1 F (36.7 C)    TempSrc: Oral Oral    SpO2: 100% 100% 98% 98%    Constitutional: NAD, calm, comfortable Eyes: PERRL, lids and conjunctivae normal ENMT: Mucous membranes are moist. Posterior pharynx clear of any exudate or lesions.Normal dentition.  Neck: normal, supple, no masses, no thyromegaly Respiratory: clear to auscultation bilaterally, no wheezing, no crackles. Normal respiratory effort. No accessory muscle use.  Cardiovascular: Regular rate and rhythm, no murmurs / rubs / gallops. No extremity edema. 2+ pedal pulses. No carotid bruits.  Abdomen: no tenderness, no masses palpated. No hepatosplenomegaly. Bowel sounds positive.  Musculoskeletal: no clubbing / cyanosis. No joint deformity upper and lower extremities. Good ROM, no contractures. Normal muscle tone.  Skin: no rashes, lesions, ulcers. No induration Neurologic: CN 2-12 grossly intact. Sensation intact, DTR normal. Strength 5/5 in all 4.  Psychiatric: Normal judgment and insight. Alert and oriented x 3. Normal mood.    Labs on Admission: I have personally reviewed following labs and imaging studies  CBC: Recent Labs  Lab 05/09/19 1611 05/09/19 1630  WBC 6.4  --   NEUTROABS 3.7  --   HGB 12.1 12.2  HCT 37.2 36.0  MCV 92.1  --   PLT 356  --    Basic Metabolic Panel: Recent Labs  Lab 05/09/19 1611 05/09/19 1630  NA 138  137  K 3.5 3.6  CL 102 102  CO2 25  --   GLUCOSE 105* 102*  BUN 8 10  CREATININE 0.85 0.80  CALCIUM 9.4  --    GFR: Estimated Creatinine Clearance: 84.9 mL/min (by C-G formula based on SCr of 0.8 mg/dL). Liver Function Tests: Recent Labs  Lab 05/09/19 1611  AST 22  ALT 51*  ALKPHOS 158*  BILITOT 0.5  PROT 7.4  ALBUMIN 3.7   No results for input(s): LIPASE, AMYLASE in the last 168 hours. No results for input(s): AMMONIA in the last 168  hours. Coagulation Profile: Recent Labs  Lab 05/09/19 1611  INR 1.0   Cardiac Enzymes: No results for input(s): CKTOTAL, CKMB, CKMBINDEX, TROPONINI in the last 168 hours. BNP (last 3 results) No results for input(s): PROBNP in the last 8760 hours. HbA1C: No results for input(s): HGBA1C in the last 72 hours. CBG: Recent Labs  Lab 05/09/19 2349  GLUCAP 104*   Lipid Profile: No results for input(s): CHOL, HDL, LDLCALC, TRIG, CHOLHDL, LDLDIRECT in the last 72 hours. Thyroid Function Tests: No results for input(s): TSH, T4TOTAL, FREET4, T3FREE, THYROIDAB in the last 72 hours. Anemia Panel: No results for input(s): VITAMINB12, FOLATE, FERRITIN, TIBC, IRON, RETICCTPCT in the last 72 hours. Urine analysis: No results found for: COLORURINE, APPEARANCEUR, LABSPEC, [employer], GLUCOSEU, HGBUR, BILIRUBINUR, KETONESUR, PROTEINUR, UROBILINOGEN, NITRITE, LEUKOCYTESUR  Radiological Exams on Admission: Ct Head Wo Contrast  Result Date: 05/09/2019 CLINICAL DATA:  Pt in with daughter reporting memory loss in patient for the last few days EXAM: CT HEAD WITHOUT CONTRAST TECHNIQUE: Contiguous axial images were obtained from the base of the skull through the vertex without intravenous contrast. COMPARISON:  None. FINDINGS: Brain: Hypoattenuation in the left basal ganglia and thalamus, possibly subacute infarct. No acute hemorrhage. No hydrocephalus, extra-axial collection, mass lesion, or mass effect. Vascular: No hyperdense vessel or unexpected calcification. Skull: Normal. Negative for fracture or focal lesion. Sinuses/Orbits: No acute finding. Other: None IMPRESSION: Hypoattenuation in the left basal ganglia and thalamus, possibly subacute infarct. No acute hemorrhage. Electronically Signed   By: Lucrezia Europe M.D.   On: 05/09/2019 18:57    EKG: Independently reviewed.  Assessment/Plan Principal Problem:   Acute ischemic stroke ([place])    1. Acute ischemic stroke - 1. Stroke pathway and work up 2. MRI  pending 3. Neuro consulted 4. ASA 325 for the moment 5. 2d echo and carotid dopplers 6. Tele monitor 7. PT/OT/SLP 2. ? Undiagnosed chronic HTN - 1. Holding off on starting therapy and allowing permissive HTN in setting of acute stroke.  DVT prophylaxis: Lovenox Code Status: Full Family Communication: Family at bedside Disposition Plan: Home after admit Consults called: Neuro Admission status: Place in 34   Daylee Delahoz, [place] Hospitalists  How to contact the [facility] Attending or Consulting provider [place] or covering provider during after hours [place], for this patient?  1. Check the care team in [facility] and look for a) attending/consulting [facility] provider listed and b) the [facility] team listed 2. Log into [email or web address]  Amion Physician Scheduling and messaging for groups and whole hospitals  On call and physician scheduling software for group practices, residents, hospitalists and other medical providers for call, clinic, rotation and shift schedules. OnCall [employer] is a hospital-wide system for scheduling doctors and paging doctors on call. EasyPlot is for scientific plotting and data analysis.  [email or web address]  and use [facility] universal password to access. If you do not have the password, please contact  the hospital operator.  3. Locate the [facility] provider you are looking for under [facility] and page to a number that you can be directly reached. 4. If you still have difficulty reaching the provider, please page the [facility] (Director on Call) for the Hospitalists listed on amion for assistance.  05/10/2019, 1:27 AM

## 2019-05-10 NOTE — ED Notes (Signed)
ED TO INPATIENT HANDOFF REPORT  ED Nurse Name and Phone #: [phone]  S Name/Age/Gender Carolyn Mclaughlin 48 y.o. female Room/Bed: 054C/054C  Code Status   Code Status: Full Code  Home/SNF/Other Home Patient oriented to: self, place, time and situation Is this baseline? Yes   Triage Complete: Triage complete  Chief Complaint Memory loss  Triage Note Pt in with daughter reporting memory loss in patient for the last few days, pt answering questions appropriately in triage, went to urgent care prior to here and was not answering orientation questions appropriately, denies weakness   Allergies No Known Allergies  Level of Care/Admitting Diagnosis ED Disposition    ED Disposition Condition [place]: [place] [[id number]]  Level of Care: Telemetry Medical [104]  I expect the patient will be discharged within 24 hours: No (not a candidate for 5C-Observation unit)  Covid Evaluation: Screening Protocol (No Symptoms)  Diagnosis: Acute ischemic stroke [facility]) [329924]  Admitting Physician: Etta Quill [phone]  Attending Physician: Etta Quill [[id number]]  PT Class (Do Not Modify): Observation [104]  PT Acc Code (Do Not Modify): Observation [10022]       B Medical/Surgery History Past Medical History:  Diagnosis Date  . Headache   . Incarcerated epigastric hernia 04/26/2019  . Umbilical hernia 03/19/6221   Past Surgical History:  Procedure Laterality Date  . NO PAST SURGERIES    . VENTRAL HERNIA REPAIR N/A 04/26/2019   Procedure: LAPRASCOPIC REPAIR OF INCARCERATED EPIGASTRIC HERNIA WITH MESH;  Surgeon: Fanny Skates, MD;  Location: [facility];  Service: General;  Laterality: N/A;     A IV Location/Drains/Wounds Patient Lines/Drains/Airways Status   Active Line/Drains/Airways    Name:   Placement date:   Placement time:   Site:   Days:   Peripheral IV 05/10/19 Left Antecubital   05/10/19    0027    Antecubital   less than 1    Incision (Closed) 04/26/19 Abdomen   04/26/19    1135     14          Intake/Output Last 24 hours No intake or output data in the 24 hours ending 05/10/19 0515  Labs/Imaging Results for orders placed or performed during the hospital encounter of 05/09/19 (from the past 48 hour(s))  Protime-INR     Status: None   Collection Time: 05/09/19  4:11 PM  Result Value Ref Range   Prothrombin Time 12.9 11.4 - 15.2 seconds   INR 1.0 0.8 - 1.2    Comment: (NOTE) INR goal varies based on device and disease states. Performed at [facility], [place] [address], [place], [place] [address]   APTT     Status: None   Collection Time: 05/09/19  4:11 PM  Result Value Ref Range   aPTT 33 24 - 36 seconds    Comment: Performed at [facility] [address], [place], [place] [address]  CBC     Status: None   Collection Time: 05/09/19  4:11 PM  Result Value Ref Range   WBC 6.4 4.0 - 10.5 K/uL   RBC 4.04 3.87 - 5.11 MIL/uL   Hemoglobin 12.1 12.0 - 15.0 g/dL   HCT 37.2 36.0 - 46.0 %   MCV 92.1 80.0 - 100.0 fL   MCH 30.0 26.0 - 34.0 pg   MCHC 32.5 30.0 - 36.0 g/dL   RDW 11.8 11.5 - 15.5 %   Platelets 356 150 - 400 K/uL  nRBC 0.0 0.0 - 0.2 %    Comment: Performed at [facility], [address] [place], [place] [address]  Differential     Status: None   Collection Time: 05/09/19  4:11 PM  Result Value Ref Range   Neutrophils Relative % 57 %   Neutro Abs 3.7 1.7 - 7.7 K/uL   Lymphocytes Relative 30 %   Lymphs Abs 1.9 0.7 - 4.0 K/uL   Monocytes Relative 9 %   Monocytes Absolute 0.5 0.1 - 1.0 K/uL   Eosinophils Relative 3 %   Eosinophils Absolute 0.2 0.0 - 0.5 K/uL   Basophils Relative 1 %   Basophils Absolute 0.0 0.0 - 0.1 K/uL   Immature Granulocytes 0 %   Abs Immature Granulocytes 0.02 0.00 - 0.07 K/uL    Comment: Performed at [facility], [address] [place], [place] [address]  Comprehensive metabolic panel     Status: Abnormal   Collection Time: 05/09/19   4:11 PM  Result Value Ref Range   Sodium 138 135 - 145 mmol/L   Potassium 3.5 3.5 - 5.1 mmol/L   Chloride 102 98 - 111 mmol/L   CO2 25 22 - 32 mmol/L   Glucose, Bld 105 (H) 70 - 99 mg/dL   BUN 8 6 - 20 mg/dL   Creatinine, Ser 0.98 0.44 - 1.00 mg/dL   Calcium 9.4 8.9 - 11.9 mg/dL   Total Protein 7.4 6.5 - 8.1 g/dL   Albumin 3.7 3.5 - 5.0 g/dL   AST 22 15 - 41 U/L   ALT 51 (H) 0 - 44 U/L   Alkaline Phosphatase 158 (H) 38 - 126 U/L   Total Bilirubin 0.5 0.3 - 1.2 mg/dL   GFR calc non Af Amer >60 >60 mL/min   GFR calc Af Amer >60 >60 mL/min   Anion gap 11 5 - 15    Comment: Performed at [facility], [address], [place], [place] [address]  I-Stat beta hCG blood, ED     Status: None   Collection Time: 05/09/19  4:28 PM  Result Value Ref Range   I-stat hCG, quantitative <5.0 <5 mIU/mL   Comment 3            Comment:   GEST. AGE      CONC.  (mIU/mL)   <=1 WEEK        5 - 50     2 WEEKS       50 - 500     3 WEEKS       100 - 10,000     4 WEEKS     1,000 - 30,000        FEMALE AND NON-PREGNANT FEMALE:     LESS THAN 5 mIU/mL   I-stat chem 8, ED     Status: Abnormal   Collection Time: 05/09/19  4:30 PM  Result Value Ref Range   Sodium 137 135 - 145 mmol/L   Potassium 3.6 3.5 - 5.1 mmol/L   Chloride 102 98 - 111 mmol/L   BUN 10 6 - 20 mg/dL   Creatinine, Ser 9.56 0.44 - 1.00 mg/dL   Glucose, Bld 213 (H) 70 - 99 mg/dL   Calcium, Ion 0.86 5.78 - 1.40 mmol/L   TCO2 28 22 - 32 mmol/L   Hemoglobin 12.2 12.0 - 15.0 g/dL   HCT 46.9 62.9 - 52.8 %  CBG monitoring, ED     Status: Abnormal   Collection  Time: 05/09/19 11:49 PM  Result Value Ref Range   Glucose-Capillary 104 (H) 70 - 99 mg/dL  SARS Coronavirus 2 (CEPHEID - Performed in [facility] hospital lab), Hosp Order     Status: None   Collection Time: 05/10/19 12:29 AM  Result Value Ref Range   SARS Coronavirus 2 NEGATIVE NEGATIVE    Comment: (NOTE) If result is NEGATIVE SARS-CoV-2 target nucleic acids are NOT  DETECTED. The SARS-CoV-2 RNA is generally detectable in upper and lower  respiratory specimens during the acute phase of infection. The lowest  concentration of SARS-CoV-2 viral copies this assay can detect is 250  copies / mL. A negative result does not preclude SARS-CoV-2 infection  and should not be used as the sole basis for treatment or other  patient management decisions.  A negative result may occur with  improper specimen collection / handling, submission of specimen other  than nasopharyngeal swab, presence of viral mutation(s) within the  areas targeted by this assay, and inadequate number of viral copies  (<250 copies / mL). A negative result must be combined with clinical  observations, patient history, and epidemiological information. If result is POSITIVE SARS-CoV-2 target nucleic acids are DETECTED. The SARS-CoV-2 RNA is generally detectable in upper and lower  respiratory specimens dur ing the acute phase of infection.  Positive  results are indicative of active infection with SARS-CoV-2.  Clinical  correlation with patient history and other diagnostic information is  necessary to determine patient infection status.  Positive results do  not rule out bacterial infection or co-infection with other viruses. If result is PRESUMPTIVE POSTIVE SARS-CoV-2 nucleic acids MAY BE PRESENT.   A presumptive positive result was obtained on the submitted specimen  and confirmed on repeat testing.  While 2019 novel coronavirus  (SARS-CoV-2) nucleic acids may be present in the submitted sample  additional confirmatory testing may be necessary for epidemiological  and / or clinical management purposes  to differentiate between  SARS-CoV-2 and other Sarbecovirus currently known to infect humans.  If clinically indicated additional testing with an alternate test  methodology [id number]) is advised. The SARS-CoV-2 RNA is generally  detectable in upper and lower respiratory sp ecimens during  the acute  phase of infection. The expected result is Negative. Fact Sheet for Patients:  BoilerBrush.com.cy Fact Sheet for Healthcare Providers: https://pope.com/ This test is not yet approved or cleared by the [place] FDA and has been authorized for detection and/or diagnosis of SARS-CoV-2 by FDA under an Emergency Use Authorization (EUA).  This EUA will remain in effect (meaning this test can be used) for the duration of the COVID-19 declaration under Section 564(b)(1) of the Act, 21 U.S.C. section 360bbb-3(b)(1), unless the authorization is terminated or revoked sooner. Performed at [facility], [address], [place], [place] [address]   Hemoglobin A1c     Status: Abnormal   Collection Time: 05/10/19  3:41 AM  Result Value Ref Range   Hgb A1c MFr Bld 5.8 (H) 4.8 - 5.6 %    Comment: (NOTE) Pre diabetes:          5.7%-6.4% Diabetes:              >6.4% Glycemic control for   <7.0% adults with diabetes    Mean Plasma Glucose 119.76 mg/dL    Comment: Performed at [facility], [address] [place], [place] [address]  Lipid panel     Status: Abnormal   Collection Time: 05/10/19  3:41 AM  Result Value Ref Range   Cholesterol 173 0 - 200 mg/dL   Triglycerides 52 <147<150 mg/dL   HDL 46 >82>40 mg/dL   Total CHOL/HDL Ratio 3.8 RATIO   VLDL 10 0 - 40 mg/dL   LDL Cholesterol 956117 (H) 0 - 99 mg/dL    Comment:        Total Cholesterol/HDL:CHD Risk Coronary Heart Disease Risk Table                     Men   Women  1/2 Average Risk   3.4   3.3  Average Risk       5.0   4.4  2 X Average Risk   9.6   7.1  3 X Average Risk  23.4   11.0        Use the calculated Patient Ratio above and the CHD Risk Table to determine the patient's CHD Risk.        ATP III CLASSIFICATION (LDL):  <100     mg/dL   Optimal  [id number]  mg/dL   Near or Above                    Optimal  130-159  mg/dL   Borderline  [phone]  mg/dL   High   >629>190     mg/dL   Very High Performed at [facility], [address] [place], [place] [address]   Troponin I - ONCE - STAT     Status: None   Collection Time: 05/10/19  3:41 AM  Result Value Ref Range   Troponin I <0.03 <0.03 ng/mL    Comment: Performed at [facility], [address] [place], [place] [address]   Dg Chest 2 View  Result Date: 05/10/2019 CLINICAL DATA:  Initial evaluation for acute stroke. EXAM: CHEST - 2 VIEW COMPARISON:  Prior radiograph 02/18/2012. FINDINGS: The cardiac and mediastinal silhouettes are stable in size and contour, and remain within normal limits. The lungs are normally inflated. No airspace consolidation, pleural effusion, or pulmonary edema is identified. There is no pneumothorax. No acute osseous abnormality identified. IMPRESSION: No active cardiopulmonary disease. Electronically Signed   By: Rise MuBenjamin  McClintock M.D.   On: 05/10/2019 01:39   Ct Head Wo Contrast  Result Date: 05/09/2019 CLINICAL DATA:  Pt in with daughter reporting memory loss in patient for the last few days EXAM: CT HEAD WITHOUT CONTRAST TECHNIQUE: Contiguous axial images were obtained from the base of the skull through the vertex without intravenous contrast. COMPARISON:  None. FINDINGS: Brain: Hypoattenuation in the left basal ganglia and thalamus, possibly subacute infarct. No acute hemorrhage. No hydrocephalus, extra-axial collection, mass lesion, or mass effect. Vascular: No hyperdense vessel or unexpected calcification. Skull: Normal. Negative for fracture or focal lesion. Sinuses/Orbits: No acute finding. Other: None IMPRESSION: Hypoattenuation in the left basal ganglia and thalamus, possibly subacute infarct. No acute hemorrhage. Electronically Signed   By: Corlis Leak  Hassell M.D.   On: 05/09/2019 18:57   Mr Shirlee LatchMra Head NUWo Contrast  Result Date: 05/10/2019 CLINICAL DATA:  Initial evaluation for acute stroke, subacute stroke on prior CT. EXAM: MRI HEAD WITHOUT CONTRAST MRA HEAD WITHOUT  CONTRAST TECHNIQUE: Multiplanar, multiecho pulse sequences of the brain and surrounding structures were obtained without intravenous contrast. Angiographic images of the head were obtained using MRA technique without contrast. COMPARISON:  Prior CT from 05/09/2019 FINDINGS: MRI HEAD FINDINGS Brain: Cerebral volume within normal limits for age. No significant cerebral white  matter changes. 2 cm subacute ischemic nonhemorrhagic infarcts seen involving the ventral medial left thalamus, corresponding with abnormality on prior CT. Associated T2/FLAIR signal abnormality without significant regional mass effect or edema. No other evidence for acute or subacute ischemia. Gray-white matter differentiation otherwise maintained. No other areas of remote or chronic infarction. No foci of susceptibility artifact to suggest acute or chronic intracranial hemorrhage. No mass lesion, midline shift or mass effect. Ventricles normal size without hydrocephalus. No extra-axial fluid collection. Pituitary gland suprasellar region normal. Midline structures intact and normal. Vascular: Major intracranial vascular flow voids are well maintained. Skull and upper cervical spine: Craniocervical junction normal. Upper cervical spine within normal limits. No focal marrow replacing lesion. No scalp soft tissue abnormality. Sinuses/Orbits: Globes and orbital soft tissues demonstrate no acute finding. Mild axial myopia noted. Paranasal sinuses are largely clear. No mastoid effusion. Inner ear structures normal. Other: None. MRA HEAD FINDINGS ANTERIOR CIRCULATION: Distal cervical segments of the internal carotid arteries are patent with symmetric antegrade flow. Distal cervical right ICA mildly tortuous. Petrous, cavernous, and supraclinoid segments patent without hemodynamically significant stenosis. A1 segments patent bilaterally. Normal anterior communicating artery. Anterior cerebral arteries widely patent to their distal aspects. No M1  stenosis or occlusion. Normal MCA bifurcations. Distal MCA branches well perfused and symmetric. POSTERIOR CIRCULATION: Vertebral arteries largely code dominant and widely patent to the vertebrobasilar junction. Posterior inferior cerebral arteries patent bilaterally. Basilar patent to its distal aspect without stenosis. 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 Superior cerebral arteries patent bilaterally. PCAs supplied via hypoplastic P1 segments as well as prominent bilateral posterior communicating arteries. PCAs well perfused to their distal aspects without stenosis. No intracranial aneurysm or other vascular abnormality. IMPRESSION: MRI HEAD IMPRESSION: 1. 2 cm subacute ischemic nonhemorrhagic infarct involving the ventromedial left thalamus. 2. Otherwise normal brain MRI for age. MRA HEAD IMPRESSION: Normal intracranial MRA. No large vessel occlusion. No hemodynamically significant or correctable stenosis. Electronically Signed   By: Rise MuBenjamin  McClintock M.D.   On: 05/10/2019 03:04   Mr Brain Wo Contrast  Result Date: 05/10/2019 CLINICAL DATA:  Initial evaluation for acute stroke, subacute stroke on prior CT. EXAM: MRI HEAD WITHOUT CONTRAST MRA HEAD WITHOUT CONTRAST TECHNIQUE: Multiplanar, multiecho pulse sequences of the brain and surrounding structures were obtained without intravenous contrast. Angiographic images of the head were obtained using MRA technique without contrast. COMPARISON:  Prior CT from 05/09/2019 FINDINGS: MRI HEAD FINDINGS Brain: Cerebral volume within normal limits for age. No significant cerebral white matter changes. 2 cm subacute ischemic nonhemorrhagic infarcts seen involving the ventral medial left thalamus, corresponding with abnormality on prior CT. Associated T2/FLAIR signal abnormality without significant regional mass effect or edema. No other evidence for acute or subacute ischemia. Gray-white matter differentiation otherwise maintained. No other areas of  remote or chronic infarction. No foci of susceptibility artifact to suggest acute or chronic intracranial hemorrhage. No mass lesion, midline shift or mass effect. Ventricles normal size without hydrocephalus. No extra-axial fluid collection. Pituitary gland suprasellar region normal. Midline structures intact and normal. Vascular: Major intracranial vascular flow voids are well maintained. Skull and upper cervical spine: Craniocervical junction normal. Upper cervical spine within normal limits. No focal marrow replacing lesion. No scalp soft tissue abnormality. Sinuses/Orbits: Globes and orbital soft tissues demonstrate no acute finding. Mild axial myopia noted. Paranasal sinuses are largely clear. No mastoid effusion. Inner ear structures normal. Other: None. MRA HEAD FINDINGS ANTERIOR CIRCULATION: Distal cervical segments of the internal carotid arteries are patent with symmetric antegrade flow.  Distal cervical right ICA mildly tortuous. Petrous, cavernous, and supraclinoid segments patent without hemodynamically significant stenosis. A1 segments patent bilaterally. Normal anterior communicating artery. Anterior cerebral arteries widely patent to their distal aspects. No M1 stenosis or occlusion. Normal MCA bifurcations. Distal MCA branches well perfused and symmetric. POSTERIOR CIRCULATION: Vertebral arteries largely code dominant and widely patent to the vertebrobasilar junction. Posterior inferior cerebral arteries patent bilaterally. Basilar patent to its distal aspect without stenosis. 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 Superior cerebral arteries patent bilaterally. PCAs supplied via hypoplastic P1 segments as well as prominent bilateral posterior communicating arteries. PCAs well perfused to their distal aspects without stenosis. No intracranial aneurysm or other vascular abnormality. IMPRESSION: MRI HEAD IMPRESSION: 1. 2 cm subacute ischemic nonhemorrhagic infarct involving the  ventromedial left thalamus. 2. Otherwise normal brain MRI for age. MRA HEAD IMPRESSION: Normal intracranial MRA. No large vessel occlusion. No hemodynamically significant or correctable stenosis. Electronically Signed   By: Rise MuBenjamin  McClintock M.D.   On: 05/10/2019 03:04    Pending Labs Unresulted Labs (From admission, onward)    Start     Ordered   05/10/19 0051  HIV antibody (Routine Testing)  Once,   R     05/10/19 0053          Vitals/Pain Today's Vitals   05/10/19 0027 05/10/19 0028 05/10/19 0100 05/10/19 0510  BP:  (!) 169/103 (!) 161/106 (!) 138/91  Pulse: 82 80 80 78  Resp: 19 12 17 15   Temp:      TempSrc:      SpO2: 98% 98% 99% 98%  PainSc:        Isolation Precautions No active isolations  Medications Medications  LORazepam (ATIVAN) injection 1 mg (has no administration in time range)   stroke: mapping our early stages of recovery book (has no administration in time range)  acetaminophen (TYLENOL) tablet 650 mg (has no administration in time range)    Or  acetaminophen (TYLENOL) solution 650 mg (has no administration in time range)    Or  acetaminophen (TYLENOL) suppository 650 mg (has no administration in time range)  enoxaparin (LOVENOX) injection 40 mg (has no administration in time range)  sodium chloride flush (NS) 0.9 % injection 3 mL (3 mLs Intravenous Given 05/10/19 0027)    Mobility walks Low fall risk   Focused Assessments Neuro Assessment Handoff:  Swallow screen pass? Yes    NIH Stroke Scale ( + Modified Stroke Scale Criteria)  Level of Consciousness (1a.)   : Alert, keenly responsive LOC Questions (1b. )   +: Answers both questions correctly LOC Commands (1c. )   + : Performs both tasks correctly Best Gaze (2. )  +: Normal Visual (3. )  +: No visual loss Facial Palsy (4. )    : Normal symmetrical movements Motor Arm, Left (5a. )   +: No drift Motor Arm, Right (5b. )   +: No drift Motor Leg, Left (6a. )   +: No drift Motor Leg, Right  (6b. )   +: No drift Limb Ataxia (7. ): Absent Sensory (8. )   +: Normal, no sensory loss Best Language (9. )   +: No aphasia Dysarthria (10. ): Normal Extinction/Inattention (11.)   +: No Abnormality Modified SS Total  +: 0 Complete NIHSS TOTAL: 0 Last date known well: 05/07/19   Neuro Assessment: Exceptions to WDL Neuro Checks:      Last Documented NIHSS Modified Score: 0 (05/10/19 0513) Has TPA been given? No  If patient is a Neuro Trauma and patient is going to OR before floor call report to 4N Charge nurse: [phone] or [phone]     R Recommendations: See Admitting Provider Note  Report given to:   Additional Notes: mNIH 0

## 2019-05-10 NOTE — Progress Notes (Signed)
SLP Cancellation Note  Patient Details Name: Carolyn Mclaughlin MRN: [id number] DOB: 1971-04-24   Cancelled treatment:       Reason Eval/Treat Not Completed: Patient at procedure or test/unavailable(SLP will follow up. )  Tobie Poet I. Hardin Negus, [place], [place] Office number [phone] Pager [place] 05/10/2019, 9:19 AM

## 2019-05-10 NOTE — Progress Notes (Addendum)
PROGRESS NOTE    Carolyn Mclaughlin  HYQ:[id number] DOB: 10/02/1971 DOA: 05/09/2019 PCP: [facility]   Brief Narrative:  Per HPI Carolyn Mclaughlin is Carolyn Mclaughlin 48 y.o. female with medical history significant of incarcerated epigastric hernia s/p repair x2 weeks ago.  Starting on Thursday (5 days ago) she began to have new memory difficulty.  Symptoms have persisted since onset.  No associated limb weakness, facial droop, vision loss, difficulty walking, aphasia nor dysarthria.   ED Course: CT head appears to show Carolyn Mclaughlin subacute infarct in the L thalamus.  Neuro has evaluated (consult note in chart) hospitalist asked to admit.   Assessment & Plan:   Principal Problem:   Acute ischemic stroke ([place])   1. Acute ischemic stroke 1. L thalamic infarct - thought 2/2 to small vessel disease, but concern for embolic source given recent surgery/post op state 2. MRI with 2 cm subacute ischemic nonhemorrhagic infarct involving the ventromedial L thalamus, no LVO, no hemodynamically significant or correctable stenosis 3. Echo - EF 55-60% (see report) 4. Carotid US with no evidence of stenosis in L or R ICA, bilateral vertebral arteries with antegrade flow (pending final read) 5. Pending transcranial doppler with bubbles and LE doppler 6. PT/OT/SLP - recommending outpatient SLP  7. Neurology c/s, appreciate recommendations 8. Pending hypercoagulable panel 9. ASA/plavix per neurology 10. A1c 5.8, LDL 117  2. ? Undiagnosed chronic HTN - 1. Holding off on starting therapy and allowing permissive HTN in setting of acute stroke.  Mildly elevated liver enzymes, alk phos - follow   DVT prophylaxis: lovenox Code Status: full Family Communication: none at bedside, sister on phone - discussed with daughter please call family daily due to pts memory problems with stroke Disposition Plan: hopefully tomorrow after stroke workup complete, pending work up at this time, requires inpatient     Consultants:   neurology  Procedures:  Echo IMPRESSIONS    1. The left ventricle has normal systolic function, with an ejection fraction of 55-60%. The cavity size was normal. Left ventricular diastolic parameters were normal. No evidence of left ventricular regional wall motion abnormalities.  2. The right ventricle has normal systolic function. The cavity was normal. There is no increase in right ventricular wall thickness.  Carotid dopplers Summary: Right Carotid: There is no evidence of stenosis in the right ICA.  Left Carotid: There is no evidence of stenosis in the left ICA.  Vertebrals:  Bilateral vertebral arteries demonstrate antegrade flow. Subclavians: Normal flow hemodynamics were seen in bilateral subclavian              arteries.  Antimicrobials:  Anti-infectives (From admission, onward)   None     Subjective: She never noticed anything wrong. Sister says she wasn't remembering things, in Carolyn Mclaughlin fog for past few days  Objective: Vitals:   05/10/19 0554 05/10/19 0755 05/10/19 1159 05/10/19 1514  BP: 130/82 (!) 141/96 (!) 148/96 (!) 139/91  Pulse: 85 81 86 95  Resp: 19 17 17 15   Temp: 98.7 F (37.1 C) 98.4 F (36.9 C) 98.7 F (37.1 C) 98.7 F (37.1 C)  TempSrc: Oral Oral Oral Oral  SpO2: 100% 100% 100% 100%  Weight: 83.1 kg     Height: 5' 2"  (1.575 m)      No intake or output data in the 24 hours ending 05/10/19 1705 Filed Weights   05/10/19 0554  Weight: 83.1 kg    Examination:  General exam: Appears calm and comfortable  Respiratory system: Clear to  auscultation. Respiratory effort normal. Cardiovascular system: S1 & S2 heard, RRR.  Gastrointestinal system: Abdomen is nondistended, soft and nontender. Central nervous system: Alert and oriented. No focal neurological deficits. Extremities: no LEE Skin: No rashes, lesions or ulcers Psychiatry: Judgement and insight appear normal. Mood & affect appropriate.     Data Reviewed: I have  personally reviewed following labs and imaging studies  CBC: Recent Labs  Lab 05/09/19 1611 05/09/19 1630  WBC 6.4  --   NEUTROABS 3.7  --   HGB 12.1 12.2  HCT 37.2 36.0  MCV 92.1  --   PLT 356  --    Basic Metabolic Panel: Recent Labs  Lab 05/09/19 1611 05/09/19 1630  NA 138 137  K 3.5 3.6  CL 102 102  CO2 25  --   GLUCOSE 105* 102*  BUN 8 10  CREATININE 0.85 0.80  CALCIUM 9.4  --    GFR: Estimated Creatinine Clearance: 85.9 mL/min (by C-G formula based on SCr of 0.8 mg/dL). Liver Function Tests: Recent Labs  Lab 05/09/19 1611  AST 22  ALT 51*  ALKPHOS 158*  BILITOT 0.5  PROT 7.4  ALBUMIN 3.7   No results for input(s): LIPASE, AMYLASE in the last 168 hours. No results for input(s): AMMONIA in the last 168 hours. Coagulation Profile: Recent Labs  Lab 05/09/19 1611  INR 1.0   Cardiac Enzymes: Recent Labs  Lab 05/10/19 0341  TROPONINI <0.03   BNP (last 3 results) No results for input(s): PROBNP in the last 8760 hours. HbA1C: Recent Labs    05/10/19 0341  HGBA1C 5.8*   CBG: Recent Labs  Lab 05/09/19 2349  GLUCAP 104*   Lipid Profile: Recent Labs    05/10/19 0341  CHOL 173  HDL 46  LDLCALC 117*  TRIG 52  CHOLHDL 3.8   Thyroid Function Tests: No results for input(s): TSH, T4TOTAL, FREET4, T3FREE, THYROIDAB in the last 72 hours. Anemia Panel: No results for input(s): VITAMINB12, FOLATE, FERRITIN, TIBC, IRON, RETICCTPCT in the last 72 hours. Sepsis Labs: No results for input(s): PROCALCITON, LATICACIDVEN in the last 168 hours.  Recent Results (from the past 240 hour(s))  SARS Coronavirus 2 (CEPHEID - Performed in [facility] hospital lab), Hosp Order     Status: None   Collection Time: 05/10/19 12:29 AM  Result Value Ref Range Status   SARS Coronavirus 2 NEGATIVE NEGATIVE Final    Comment: (NOTE) If result is NEGATIVE SARS-CoV-2 target nucleic acids are NOT DETECTED. The SARS-CoV-2 RNA is generally detectable in upper and lower    respiratory specimens during the acute phase of infection. The lowest  concentration of SARS-CoV-2 viral copies this assay can detect is 250  copies / mL. Carolyn Mclaughlin negative result does not preclude SARS-CoV-2 infection  and should not be used as the sole basis for treatment or other  patient management decisions.  Deserae Jennings negative result may occur with  improper specimen collection / handling, submission of specimen other  than nasopharyngeal swab, presence of viral mutation(s) within the  areas targeted by this assay, and inadequate number of viral copies  (<250 copies / mL). Bertrice Leder negative result must be combined with clinical  observations, patient history, and epidemiological information. If result is POSITIVE SARS-CoV-2 target nucleic acids are DETECTED. The SARS-CoV-2 RNA is generally detectable in upper and lower  respiratory specimens dur ing the acute phase of infection.  Positive  results are indicative of active infection with SARS-CoV-2.  Clinical  correlation with patient history and other  diagnostic information is  necessary to determine patient infection status.  Positive results do  not rule out bacterial infection or co-infection with other viruses. If result is PRESUMPTIVE POSTIVE SARS-CoV-2 nucleic acids MAY BE PRESENT.   Marycarmen Hagey presumptive positive result was obtained on the submitted specimen  and confirmed on repeat testing.  While 2019 novel coronavirus  (SARS-CoV-2) nucleic acids may be present in the submitted sample  additional confirmatory testing may be necessary for epidemiological  and / or clinical management purposes  to differentiate between  SARS-CoV-2 and other Sarbecovirus currently known to infect humans.  If clinically indicated additional testing with an alternate test  methodology [phone]) is advised. The SARS-CoV-2 RNA is generally  detectable in upper and lower respiratory sp ecimens during the acute  phase of infection. The expected result is Negative. Fact  Sheet for Patients:  StrictlyIdeas.no Fact Sheet for Healthcare Providers: BankingDealers.co.za This test is not yet approved or cleared by the [place] FDA and has been authorized for detection and/or diagnosis of SARS-CoV-2 by FDA under an Emergency Use Authorization (EUA).  This EUA will remain in effect (meaning this test can be used) for the duration of the COVID-19 declaration under Section 564(b)(1) of the Act, 21 U.S.C. section 360bbb-3(b)(1), unless the authorization is terminated or revoked sooner. Performed at [facility], [place] [address] [place], [place] [address]          Radiology Studies: Dg Chest 2 View  Result Date: 05/10/2019 CLINICAL DATA:  Initial evaluation for acute stroke. EXAM: CHEST - 2 VIEW COMPARISON:  Prior radiograph 02/18/2012. FINDINGS: The cardiac and mediastinal silhouettes are stable in size and contour, and remain within normal limits. The lungs are normally inflated. No airspace consolidation, pleural effusion, or pulmonary edema is identified. There is no pneumothorax. No acute osseous abnormality identified. IMPRESSION: No active cardiopulmonary disease. Electronically Signed   By: Jeannine Boga M.D.   On: 05/10/2019 01:39   Ct Head Wo Contrast  Result Date: 05/09/2019 CLINICAL DATA:  Pt in with daughter reporting memory loss in patient for the last few days EXAM: CT HEAD WITHOUT CONTRAST TECHNIQUE: Contiguous axial images were obtained from the base of the skull through the vertex without intravenous contrast. COMPARISON:  None. FINDINGS: Brain: Hypoattenuation in the left basal ganglia and thalamus, possibly subacute infarct. No acute hemorrhage. No hydrocephalus, extra-axial collection, mass lesion, or mass effect. Vascular: No hyperdense vessel or unexpected calcification. Skull: Normal. Negative for fracture or focal lesion. Sinuses/Orbits: No acute finding. Other: None IMPRESSION:  Hypoattenuation in the left basal ganglia and thalamus, possibly subacute infarct. No acute hemorrhage. Electronically Signed   By: Lucrezia Europe M.D.   On: 05/09/2019 18:57   Mr Virgel Paling VO Contrast  Result Date: 05/10/2019 CLINICAL DATA:  Initial evaluation for acute stroke, subacute stroke on prior CT. EXAM: MRI HEAD WITHOUT CONTRAST MRA HEAD WITHOUT CONTRAST TECHNIQUE: Multiplanar, multiecho pulse sequences of the brain and surrounding structures were obtained without intravenous contrast. Angiographic images of the head were obtained using MRA technique without contrast. COMPARISON:  Prior CT from 05/09/2019 FINDINGS: MRI HEAD FINDINGS Brain: Cerebral volume within normal limits for age. No significant cerebral white matter changes. 2 cm subacute ischemic nonhemorrhagic infarcts seen involving the ventral medial left thalamus, corresponding with abnormality on prior CT. Associated T2/FLAIR signal abnormality without significant regional mass effect or edema. No other evidence for acute or subacute ischemia. Gray-white matter differentiation otherwise maintained. No other areas of remote or chronic infarction. No foci  of susceptibility artifact to suggest acute or chronic intracranial hemorrhage. No mass lesion, midline shift or mass effect. Ventricles normal size without hydrocephalus. No extra-axial fluid collection. Pituitary gland suprasellar region normal. Midline structures intact and normal. Vascular: Major intracranial vascular flow voids are well maintained. Skull and upper cervical spine: Craniocervical junction normal. Upper cervical spine within normal limits. No focal marrow replacing lesion. No scalp soft tissue abnormality. Sinuses/Orbits: Globes and orbital soft tissues demonstrate no acute finding. Mild axial myopia noted. Paranasal sinuses are largely clear. No mastoid effusion. Inner ear structures normal. Other: None. MRA HEAD FINDINGS ANTERIOR CIRCULATION: Distal cervical segments of the  internal carotid arteries are patent with symmetric antegrade flow. Distal cervical right ICA mildly tortuous. Petrous, cavernous, and supraclinoid segments patent without hemodynamically significant stenosis. A1 segments patent bilaterally. Normal anterior communicating artery. Anterior cerebral arteries widely patent to their distal aspects. No M1 stenosis or occlusion. Normal MCA bifurcations. Distal MCA branches well perfused and symmetric. POSTERIOR CIRCULATION: Vertebral arteries largely code dominant and widely patent to the vertebrobasilar junction. Posterior inferior cerebral arteries patent bilaterally. Basilar patent to its distal aspect without stenosis. 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 Superior cerebral arteries patent bilaterally. PCAs supplied via hypoplastic P1 segments as well as prominent bilateral posterior communicating arteries. PCAs well perfused to their distal aspects without stenosis. No intracranial aneurysm or other vascular abnormality. IMPRESSION: MRI HEAD IMPRESSION: 1. 2 cm subacute ischemic nonhemorrhagic infarct involving the ventromedial left thalamus. 2. Otherwise normal brain MRI for age. MRA HEAD IMPRESSION: Normal intracranial MRA. No large vessel occlusion. No hemodynamically significant or correctable stenosis. Electronically Signed   By: Jeannine Boga M.D.   On: 05/10/2019 03:04   Mr Brain Wo Contrast  Result Date: 05/10/2019 CLINICAL DATA:  Initial evaluation for acute stroke, subacute stroke on prior CT. EXAM: MRI HEAD WITHOUT CONTRAST MRA HEAD WITHOUT CONTRAST TECHNIQUE: Multiplanar, multiecho pulse sequences of the brain and surrounding structures were obtained without intravenous contrast. Angiographic images of the head were obtained using MRA technique without contrast. COMPARISON:  Prior CT from 05/09/2019 FINDINGS: MRI HEAD FINDINGS Brain: Cerebral volume within normal limits for age. No significant cerebral white matter changes. 2  cm subacute ischemic nonhemorrhagic infarcts seen involving the ventral medial left thalamus, corresponding with abnormality on prior CT. Associated T2/FLAIR signal abnormality without significant regional mass effect or edema. No other evidence for acute or subacute ischemia. Gray-white matter differentiation otherwise maintained. No other areas of remote or chronic infarction. No foci of susceptibility artifact to suggest acute or chronic intracranial hemorrhage. No mass lesion, midline shift or mass effect. Ventricles normal size without hydrocephalus. No extra-axial fluid collection. Pituitary gland suprasellar region normal. Midline structures intact and normal. Vascular: Major intracranial vascular flow voids are well maintained. Skull and upper cervical spine: Craniocervical junction normal. Upper cervical spine within normal limits. No focal marrow replacing lesion. No scalp soft tissue abnormality. Sinuses/Orbits: Globes and orbital soft tissues demonstrate no acute finding. Mild axial myopia noted. Paranasal sinuses are largely clear. No mastoid effusion. Inner ear structures normal. Other: None. MRA HEAD FINDINGS ANTERIOR CIRCULATION: Distal cervical segments of the internal carotid arteries are patent with symmetric antegrade flow. Distal cervical right ICA mildly tortuous. Petrous, cavernous, and supraclinoid segments patent without hemodynamically significant stenosis. A1 segments patent bilaterally. Normal anterior communicating artery. Anterior cerebral arteries widely patent to their distal aspects. No M1 stenosis or occlusion. Normal MCA bifurcations. Distal MCA branches well perfused and symmetric. POSTERIOR CIRCULATION: Vertebral arteries largely code dominant  and widely patent to the vertebrobasilar junction. Posterior inferior cerebral arteries patent bilaterally. Basilar patent to its distal aspect without stenosis. 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 Superior cerebral  arteries patent bilaterally. PCAs supplied via hypoplastic P1 segments as well as prominent bilateral posterior communicating arteries. PCAs well perfused to their distal aspects without stenosis. No intracranial aneurysm or other vascular abnormality. IMPRESSION: MRI HEAD IMPRESSION: 1. 2 cm subacute ischemic nonhemorrhagic infarct involving the ventromedial left thalamus. 2. Otherwise normal brain MRI for age. MRA HEAD IMPRESSION: Normal intracranial MRA. No large vessel occlusion. No hemodynamically significant or correctable stenosis. Electronically Signed   By: Jeannine Boga M.D.   On: 05/10/2019 03:04   Vas US Carotid (at [facility] Only)  Result Date: 05/10/2019 Carotid Arterial Duplex Study Indications: CVA and Memory Dysfunction. Performing Technologist: Toma Copier RVS  Examination Guidelines: Ire[place] Grant complete evaluation includes B-mode imaging, spectral Doppler, color Doppler, and power Doppler as needed of all accessible portions of each vessel. Bilateral testing is considered an integral part of Manju Kulkarni complete examination. Limited examinations for reoccurring indications may be performed as noted.  Right Carotid Findings: +----------+--------+--------+--------+--------+-------------------------+             PSV cm/s EDV cm/s Stenosis Describe Comments                   +----------+--------+--------+--------+--------+-------------------------+  CCA Prox   48       11                         mild intimal wall changes  +----------+--------+--------+--------+--------+-------------------------+  CCA Distal 48       17                                                    +----------+--------+--------+--------+--------+-------------------------+  ICA Prox   48       19                                                    +----------+--------+--------+--------+--------+-------------------------+  ICA Mid    67       25                                                     +----------+--------+--------+--------+--------+-------------------------+  ICA Distal 45       21                         tortuous                   +----------+--------+--------+--------+--------+-------------------------+  ECA        64       15                                                    +----------+--------+--------+--------+--------+-------------------------+ +----------+--------+-------+--------+-------------------+  PSV cm/s EDV cms Describe Arm Pressure (mmHG)  +----------+--------+-------+--------+-------------------+  Subclavian 69                                             +----------+--------+-------+--------+-------------------+ +---------+--------+--+--------+--+---------+  Vertebral PSV cm/s 34 EDV cm/s 10 Antegrade  +---------+--------+--+--------+--+---------+  Left Carotid Findings: +----------+--------+--------+--------+--------+-------------------------+             PSV cm/s EDV cm/s Stenosis Describe Comments                   +----------+--------+--------+--------+--------+-------------------------+  CCA Prox   82       71                         mild intimal wall changes  +----------+--------+--------+--------+--------+-------------------------+  CCA Distal 53       17                         mild intimal wall changes  +----------+--------+--------+--------+--------+-------------------------+  ICA Prox   51       23                                                    +----------+--------+--------+--------+--------+-------------------------+  ICA Mid    85       38                         tortuous                   +----------+--------+--------+--------+--------+-------------------------+  ICA Distal 86       35                         tortuous                   +----------+--------+--------+--------+--------+-------------------------+  ECA        43       15                                                     +----------+--------+--------+--------+--------+-------------------------+ +----------+--------+--------+--------+-------------------+  Subclavian PSV cm/s EDV cm/s Describe Arm Pressure (mmHG)  +----------+--------+--------+--------+-------------------+             85                                              +----------+--------+--------+--------+-------------------+ +---------+--------+--+--------+--+---------+  Vertebral PSV cm/s 47 EDV cm/s 18 Antegrade  +---------+--------+--+--------+--+---------+  Summary: Right Carotid: There is no evidence of stenosis in the right ICA. Left Carotid: There is no evidence of stenosis in the left ICA. Vertebrals:  Bilateral vertebral arteries demonstrate antegrade flow. Subclavians: Normal flow hemodynamics were seen in bilateral subclavian              arteries. *See table(s) above for measurements and observations.     Preliminary  Scheduled Meds:  aspirin  81 mg Oral Daily   atorvastatin  80 mg Oral q1800   clopidogrel  75 mg Oral Daily   enoxaparin (LOVENOX) injection  40 mg Subcutaneous Q24H   Continuous Infusions:   LOS: 0 days    Time spent: over 30 min    Fayrene Helper, MD [facility] Pager AMION  If 7PM-7AM, please contact night-coverage [email or web address] Password [email or web address] 05/10/2019, 5:05 PM

## 2019-05-10 NOTE — Evaluation (Signed)
Speech Language Pathology Evaluation Patient Details Name: Carolyn Mclaughlin MRN: [id number] DOB: September 27, 1971 Today's Date: 05/10/2019 Time: 9323-5573 SLP Time Calculation (min) (ACUTE ONLY): 32 min  Problem List:  Patient Active Problem List   Diagnosis Date Noted  . Acute ischemic stroke ([place]) 05/10/2019  . Incarcerated epigastric hernia 04/26/2019  . Umbilical hernia 22/01/5426   Past Medical History:  Past Medical History:  Diagnosis Date  . Headache   . Incarcerated epigastric hernia 04/26/2019  . Umbilical hernia 0/62/3762   Past Surgical History:  Past Surgical History:  Procedure Laterality Date  . NO PAST SURGERIES    . VENTRAL HERNIA REPAIR N/A 04/26/2019   Procedure: LAPRASCOPIC REPAIR OF INCARCERATED EPIGASTRIC HERNIA WITH MESH;  Surgeon: Fanny Skates, MD;  Location: [facility];  Service: General;  Laterality: N/A;   HPI:  Pt is a 48 y.o. female with medical history significant of incarcerated epigastric hernia s/p repair x2 weeks ago. Starting on Thursday 05/05/19 she began to have new memory difficulty. Symptoms have persisted since onset without associated limb weakness, facial droop, vision loss, difficulty walking, aphasia nor dysarthria. 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 She expressed hat she lives alone and did not have any deficits in speech, language or cognition at baseline. Per the pt she has been demonstrating increased difficulty with memory since admission which has not improved fully resolved. [facility] Cognitive Assessment 8.1 was completed to evaluate the pt's cognitive-linguistic skills. She achieved a score of 18/30 which is below the normal limits of 26 or more out of 30 and is suggestive of a mild-moderate  impairment. She demonstrated deficits in the areas of attention, mental manipulation, divergent naming, abstract reasoning, and memory. Skilled SLP services are clinically indicated at this time to improve cognition. Pt, and nursing were educated regarding results and recommendations; both parties verbalized understanding as well as agreement with plan of care.    SLP Assessment  SLP Recommendation/Assessment: Patient needs continued Speech Lanaguage Pathology Services SLP Visit Diagnosis: Cognitive communication deficit (R41.841)    Follow Up Recommendations  Outpatient SLP    Frequency and Duration min 2x/week  2 weeks      SLP Evaluation Cognition  Overall Cognitive Status: Impaired/Different from baseline Arousal/Alertness: Awake/alert Orientation Level: Oriented to person;Oriented to place;Oriented to situation(Oriented to month, day, year but not date;. Stated the 12th) Attention: Focused;Sustained Focused Attention: Appears intact(Vigilance WNL: 1/1) Sustained Attention: Impaired Sustained Attention Impairment: Verbal complex(Serial 7s: 2/3) Memory: Impaired Memory Impairment: Retrieval deficit;Decreased recall of new information(Immediate: 4/5; Delayed: 0/5 with cues: ) Awareness: Appears intact Problem Solving: Appears intact Executive Function: Reasoning;Sequencing;Organizing Reasoning: Impaired Reasoning Impairment: Verbal complex(Abvstraction: 1/2) Sequencing: Appears intact(Clock drawing: 3/3) Organizing: Appears intact(Backward digit span: 1/1)       Comprehension  Auditory Comprehension Overall Auditory Comprehension: Appears within functional limits for tasks assessed Yes/No Questions: Within Functional Limits Commands: Within Functional Limits Complex Commands: (Trail completion: 1/1; additional processing time needed) Conversation: Complex Visual Recognition/Discrimination Discrimination: Within Function Limits Reading Comprehension Reading Status: Within  funtional limits    Expression Expression Primary Mode of Expression: Verbal Verbal Expression Overall Verbal Expression: Appears within functional limits for tasks assessed Initiation: No impairment Level of Generative/Spontaneous Verbalization: Conversation Repetition: No impairment(2/2) Naming: Impairment Responsive: Not tested Confrontation: (1/3) Convergent: Not tested Divergent: (0/1) Pragmatics:  No impairment Written Expression Dominant Hand: Right Written Expression: (Copying cube: 0/1)   Oral / Motor  Oral Motor/Sensory Function Overall Oral Motor/Sensory Function: Within functional limits Motor Speech Overall Motor Speech: Appears within functional limits for tasks assessed Respiration: Within functional limits Phonation: Normal Resonance: Within functional limits Articulation: Within functional limitis Intelligibility: Intelligible Motor Planning: Witnin functional limits Motor Speech Errors: Not applicable   Carolyn Mclaughlin I. Carolyn ClockPhillips, Carolyn Mclaughlin, Carolyn Mclaughlin Acute Rehabilitation Services Office number [phone] Pager [phone]                     Carolyn Mclaughlin 05/10/2019, 1:18 PM

## 2019-05-10 NOTE — ED Notes (Signed)
Daughter: Carolyn Mclaughlin [phone] would like to be called with updates throughout stay.

## 2019-05-10 NOTE — Consult Note (Signed)
Referring Physician: Dr. Alcario Drought    Chief Complaint: Memory dysfunction  HPI: Carolyn Mclaughlin is an 48 y.o. female presenting with memory dysfunction since Thursday. Daughter and patient both agree that her memory deficit for recent events spans a time window of about the last 3 weeks, but that the onset of the symptoms were on Thursday. She denies any other neurological complaints, including no vision loss, aphasia, dysarthria, facial droop, limb weakness, limb numbness, trouble walking or incoordination.    Past Medical History:  Diagnosis Date  . Headache   . Incarcerated epigastric hernia 04/26/2019  . Umbilical hernia 9/92/4268    Past Surgical History:  Procedure Laterality Date  . NO PAST SURGERIES    . VENTRAL HERNIA REPAIR N/A 04/26/2019   Procedure: LAPRASCOPIC REPAIR OF INCARCERATED EPIGASTRIC HERNIA WITH MESH;  Surgeon: Fanny Skates, MD;  Location: [facility];  Service: General;  Laterality: N/A;    Family History  Family history unknown: Yes   Social History:  reports that she has never smoked. She has never used smokeless tobacco. She reports current alcohol use. She reports that she does not use drugs.  Allergies: No Known Allergies  Medications:  Advil Relafen  ROS: No SOB, cough or fever. Other ROS as per HPI with all other systems negative.   Physical Examination: Blood pressure (!) 169/103, pulse 80, temperature 98.1 F (36.7 C), temperature source Oral, resp. rate 12, SpO2 98 %.  HEENT: [place]/AT Lungs: Respirations unlabored Ext: No edema  Neurologic Examination: Mental Status:  Alert, fully oriented, thought content appropriate.  Speech fluent without evidence of aphasia.  Able to follow a 3 step command after 2 trials with errors. Unable to recall the name of the president or visualize with ease what he looks like. Unable to name the second to last president but did remember Bush as the 3rd to last.  Cranial Nerves: II:  Visual fields intact with no  extinction to DSS. PERRL.  III,IV, VI: EOMI without nystagmus. No ptosis.  V,VII: No facial droop. Temp sensation equal.  VIII: hearing intact to voice IX,X: No hypophonia XI: bilateral shoulder shrug with subtle/equivocal lag on the left XII: midline tongue extension  Motor: Right : Upper extremity   5/5    Left:     Upper extremity   5/5  Lower extremity   5/5     Lower extremity   5/5 No pronator drift Sensory: Normal temp and FT sensation bilaterally Deep Tendon Reflexes:  1+ right biceps and brachioradialis. 4+ right patellar (crossed adductor response on left) 2+ right biceps and brachioradialis, 3+ left patellar, 2+ left achilles Toes downgoing bilaterally  Cerebellar: No ataxia with FNF or H-S bilaterally Gait: Deferred  Results for orders placed or performed during the hospital encounter of 05/09/19 (from the past 48 hour(s))  Protime-INR     Status: None   Collection Time: 05/09/19  4:11 PM  Result Value Ref Range   Prothrombin Time 12.9 11.4 - 15.2 seconds   INR 1.0 0.8 - 1.2    Comment: (NOTE) INR goal varies based on device and disease states. Performed at [facility], [place] [address] [place], [place] [address]   APTT     Status: None   Collection Time: 05/09/19  4:11 PM  Result Value Ref Range   aPTT 33 24 - 36 seconds    Comment: Performed at [place] [address] [place], [place] [address]  CBC     Status: None  Collection Time: 05/09/19  4:11 PM  Result Value Ref Range   WBC 6.4 4.0 - 10.5 K/uL   RBC 4.04 3.87 - 5.11 MIL/uL   Hemoglobin 12.1 12.0 - 15.0 g/dL   HCT 69.637.2 29.536.0 - 28.446.0 %   MCV 92.1 80.0 - 100.0 fL   MCH 30.0 26.0 - 34.0 pg   MCHC 32.5 30.0 - 36.0 g/dL   RDW 13.211.8 44.011.5 - 10.215.5 %   Platelets 356 150 - 400 K/uL   nRBC 0.0 0.0 - 0.2 %    Comment: Performed at [facility], [address] [place], [place] [address]  Differential     Status: None   Collection Time: 05/09/19  4:11 PM  Result Value Ref Range    Neutrophils Relative % 57 %   Neutro Abs 3.7 1.7 - 7.7 K/uL   Lymphocytes Relative 30 %   Lymphs Abs 1.9 0.7 - 4.0 K/uL   Monocytes Relative 9 %   Monocytes Absolute 0.5 0.1 - 1.0 K/uL   Eosinophils Relative 3 %   Eosinophils Absolute 0.2 0.0 - 0.5 K/uL   Basophils Relative 1 %   Basophils Absolute 0.0 0.0 - 0.1 K/uL   Immature Granulocytes 0 %   Abs Immature Granulocytes 0.02 0.00 - 0.07 K/uL    Comment: Performed at [facility], [address] [place], [place] [address]  Comprehensive metabolic panel     Status: Abnormal   Collection Time: 05/09/19  4:11 PM  Result Value Ref Range   Sodium 138 135 - 145 mmol/L   Potassium 3.5 3.5 - 5.1 mmol/L   Chloride 102 98 - 111 mmol/L   CO2 25 22 - 32 mmol/L   Glucose, Bld 105 (H) 70 - 99 mg/dL   BUN 8 6 - 20 mg/dL   Creatinine, Ser 4.740.85 0.44 - 1.00 mg/dL   Calcium 9.4 8.9 - 25.910.3 mg/dL   Total Protein 7.4 6.5 - 8.1 g/dL   Albumin 3.7 3.5 - 5.0 g/dL   AST 22 15 - 41 U/L   ALT 51 (H) 0 - 44 U/L   Alkaline Phosphatase 158 (H) 38 - 126 U/L   Total Bilirubin 0.5 0.3 - 1.2 mg/dL   GFR calc non Af Amer >60 >60 mL/min   GFR calc Af Amer >60 >60 mL/min   Anion gap 11 5 - 15    Comment: Performed at [facility], [address] [place], [place] [address]  I-Stat beta hCG blood, ED     Status: None   Collection Time: 05/09/19  4:28 PM  Result Value Ref Range   I-stat hCG, quantitative <5.0 <5 mIU/mL   Comment 3            Comment:   GEST. AGE      CONC.  (mIU/mL)   <=1 WEEK        5 - 50     2 WEEKS       50 - 500     3 WEEKS       100 - 10,000     4 WEEKS     1,000 - 30,000        FEMALE AND NON-PREGNANT FEMALE:     LESS THAN 5 mIU/mL   I-stat chem 8, ED     Status: Abnormal   Collection Time: 05/09/19  4:30 PM  Result Value Ref Range   Sodium 137 135 - 145 mmol/L   Potassium 3.6  3.5 - 5.1 mmol/L   Chloride 102 98 - 111 mmol/L   BUN 10 6 - 20 mg/dL   Creatinine, Ser 1.610.80 0.44 - 1.00 mg/dL   Glucose, Bld 096102 (H) 70 -  99 mg/dL   Calcium, Ion 0.451.19 4.091.15 - 1.40 mmol/L   TCO2 28 22 - 32 mmol/L   Hemoglobin 12.2 12.0 - 15.0 g/dL   HCT 81.136.0 91.436.0 - 78.246.0 %  CBG monitoring, ED     Status: Abnormal   Collection Time: 05/09/19 11:49 PM  Result Value Ref Range   Glucose-Capillary 104 (H) 70 - 99 mg/dL   Ct Head Wo Contrast  Result Date: 05/09/2019 CLINICAL DATA:  Pt in with daughter reporting memory loss in patient for the last few days EXAM: CT HEAD WITHOUT CONTRAST TECHNIQUE: Contiguous axial images were obtained from the base of the skull through the vertex without intravenous contrast. COMPARISON:  None. FINDINGS: Brain: Hypoattenuation in the left basal ganglia and thalamus, possibly subacute infarct. No acute hemorrhage. No hydrocephalus, extra-axial collection, mass lesion, or mass effect. Vascular: No hyperdense vessel or unexpected calcification. Skull: Normal. Negative for fracture or focal lesion. Sinuses/Orbits: No acute finding. Other: None IMPRESSION: Hypoattenuation in the left basal ganglia and thalamus, possibly subacute infarct. No acute hemorrhage. Electronically Signed   By: Corlis Leak  Hassell M.D.   On: 05/09/2019 18:57    Assessment: 48 y.o. female presenting with memory changes since Thursday. CT head reveals a small left thalamic ischemic infarction, most likely subacute.  1. Exam reveals no aphasia, visual field cut or focal motor or sensory deficit. Inability to recall some commonly known facts appears more consistent with a mild concentration or executive function deficit than memory dysfunction.  2. Stroke Risk Factors - None, but may have undiagnosed chronic HTN based on BP here in the ED.   Recommendations: 1. HgbA1c, fasting lipid panel, CK level 2. MRI, MRA  of the brain without contrast 3. PT consult, OT consult, Speech consult 4. TTE 5. Carotid ultrasound 6. Prophylactic therapy- Start ASA.  7. Risk factor modification 8. Telemetry monitoring 9. Frequent neuro checks   @Electronically   signed: Dr. Caryl PinaEric Talma Aguillard@ 05/10/2019, 1:05 AM

## 2019-05-10 NOTE — Evaluation (Signed)
Occupational Therapy Evaluation and Discharge Patient Details Name: Carolyn AwkwardCynthia M Mclaughlin MRN: [id number] DOB: 11/06/1971 Today's Date: 05/10/2019    History of Present Illness  Carolyn Mclaughlin is a 48 y.o. female with medical history significant of incarcerated epigastric hernia s/p repair x2 weeks ago. Presents now with new memory loss, CT showed L subacute thalamic stroke.    Clinical Impression   Pt presents to OT following L subacute thalamic stroke. Pt is at baseline with her ADLs and functional mobility, but reports ongoing memory deficits. Pt was alert and oriented and was able to recall previous conversations with other staff. Recommended pt to have supervision at home initially to ensure no memory deficits impact home safety. No further OT needs at this time.     Follow Up Recommendations  No OT follow up;Supervision - Intermittent    Equipment Recommendations  None recommended by OT    Recommendations for Other Services Speech consult     Precautions / Restrictions Precautions Precautions: None Restrictions Weight Bearing Restrictions: No      Mobility Bed Mobility Overal bed mobility: Independent                Transfers Overall transfer level: Independent Equipment used: None                  Balance Overall balance assessment: Independent                                         ADL either performed or assessed with clinical judgement   ADL Overall ADL's : Modified independent;At baseline                                       General ADL Comments: Provided edu for decreasing fall risk with LB dressing, including remaining seated for threading BLE and safe shower transfer techniques      Vision Baseline Vision/History: No visual deficits Patient Visual Report: No change from baseline Vision Assessment?: No apparent visual deficits     Perception     Praxis Praxis Praxis tested?: Within functional  limits    Pertinent Vitals/Pain Pain Assessment: No/denies pain     Hand Dominance Right   Extremity/Trunk Assessment Upper Extremity Assessment Upper Extremity Assessment: Overall WFL for tasks assessed   Lower Extremity Assessment Lower Extremity Assessment: Overall WFL for tasks assessed   Cervical / Trunk Assessment Cervical / Trunk Assessment: Normal   Communication Communication Communication: No difficulties   Cognition Arousal/Alertness: Awake/alert Behavior During Therapy: WFL for tasks assessed/performed Overall Cognitive Status: Impaired/Different from baseline Area of Impairment: Memory                     Memory: Decreased short-term memory         General Comments: pt reports she cannot recall her banking numbers which have recently been switched and cannot recall details of past several days   General Comments  Pt with appropriate questions re condition and memory deficits             Home Living Family/patient expects to be discharged to:: Private residence Living Arrangements: Alone Available Help at Discharge: Family;Available PRN/intermittently Type of Home: Apartment Home Access: Stairs to enter Entrance Stairs-Number of Steps: flight Entrance Stairs-Rails: Right Home Layout: Two level;Bed/bath upstairs  Bathroom Shower/Tub: Advertising copywriter: No   Home Equipment: None          Prior Functioning/Environment Level of Independence: Independent        Comments: pt works at a school, does Nurse, adult Problem List: Decreased cognition         OT Goals(Current goals can be found in the care plan section) Acute Rehab OT Goals Patient Stated Goal: find out what's wrong with me. Get back to work to [employer] OT Goal Formulation: All assessment and education complete, DC therapy   AM-PAC OT "6 Clicks" Daily Activity     Outcome Measure Help from another person  eating meals?: None Help from another person taking care of personal grooming?: None Help from another person toileting, which includes using toliet, bedpan, or urinal?: None Help from another person bathing (including washing, rinsing, drying)?: None Help from another person to put on and taking off regular upper body clothing?: None Help from another person to put on and taking off regular lower body clothing?: None 6 Click Score: 24   End of Session    Activity Tolerance: Patient tolerated treatment well Patient left: Other (comment)(handed off to PT )  OT Visit Diagnosis: Other symptoms and signs involving cognitive function                Time: 9381-8299 OT Time Calculation (min): 17 min Charges:  OT General Charges $OT Visit: 1 Visit OT Evaluation $OT Eval Low Complexity: 1 Low  Curtis Sites OTR/L  05/10/2019, 10:34 AM

## 2019-05-10 NOTE — Evaluation (Signed)
Physical Therapy Evaluation Patient Details Name: Carolyn Mclaughlin Strutz MRN: [id number] DOB: 1971-03-17 Today's Date: 05/10/2019   History of Present Illness   Carolyn Mclaughlin Garde is a 48 y.o. female with medical history significant of incarcerated epigastric hernia s/p repair x2 weeks ago. Presents now with new memory loss, CT showed L subacute thalamic stroke.   Clinical Impression  Patient evaluated by Physical Therapy with no further acute PT needs identified. All education has been completed and the patient has no further questions. Pt independent with mobility, normal strength and balance. Pt continues to have some STM issues. Of note, when ambulating level surface, HR up to 110 bpm, when ascending stairs, HR up to 130 bpm and took >3 mins to return to 110 bpm. Pt asymptomatic.  See below for any follow-up Physical Therapy or equipment needs. PT is signing off. Thank you for this referral.        Follow Up Recommendations No PT follow up    Equipment Recommendations  None recommended by PT    Recommendations for Other Services       Precautions / Restrictions Precautions Precautions: None Restrictions Weight Bearing Restrictions: No      Mobility  Bed Mobility Overal bed mobility: Independent                Transfers Overall transfer level: Independent                  Ambulation/Gait Ambulation/Gait assistance: Independent Gait Distance (Feet): 300 Feet Assistive device: None Gait Pattern/deviations: WFL(Within Functional Limits) Gait velocity: WFL Gait velocity interpretation: >4.37 ft/sec, indicative of normal walking speed General Gait Details: normal walking pattern and speed. HR up to 100 bpm with level surface ambulation  Stairs Stairs: Yes Stairs assistance: Independent Stair Management: One rail Right;Alternating pattern;Forwards Number of Stairs: 10 General stair comments: pt safe on stairs, HR up to 130 bpm and took >3 mins to return to 110  bpm. Pt asymptomatic  Wheelchair Mobility    Modified Rankin (Stroke Patients Only) Modified Rankin (Stroke Patients Only) Pre-Morbid Rankin Score: No symptoms Modified Rankin: No significant disability     Balance Overall balance assessment: Independent                                           Pertinent Vitals/Pain Pain Assessment: No/denies pain    Home Living Family/patient expects to be discharged to:: Private residence Living Arrangements: Alone Available Help at Discharge: Family;Available PRN/intermittently Type of Home: Apartment Home Access: Stairs to enter Entrance Stairs-Rails: Right Entrance Stairs-Number of Steps: flight Home Layout: Two level;Bed/bath upstairs Home Equipment: None      Prior Function Level of Independence: Independent         Comments: pt works at a school, does Radio broadcast assistantpayroll     Hand Dominance        Extremity/Trunk Assessment   Upper Extremity Assessment Upper Extremity Assessment: Defer to OT evaluation    Lower Extremity Assessment Lower Extremity Assessment: Overall WFL for tasks assessed    Cervical / Trunk Assessment Cervical / Trunk Assessment: Normal  Communication   Communication: No difficulties  Cognition Arousal/Alertness: Awake/alert Behavior During Therapy: WFL for tasks assessed/performed Overall Cognitive Status: Impaired/Different from baseline Area of Impairment: Memory                     Memory: Decreased short-term memory  General Comments: pt reports she cannot recall her banking numbers which have recently been switched and cannot recall details of past several days      General Comments General comments (skin integrity, edema, etc.): SL stance each leg 5 secs, able to pick up object up from floor, able to make quick turns with no LOB    Exercises     Assessment/Plan    PT Assessment Patent does not need any further PT services  PT Problem List          PT Treatment Interventions      PT Goals (Current goals can be found in the Care Plan section)  Acute Rehab PT Goals Patient Stated Goal: find out what's wrong with me. Get back to work to [employer] PT Goal Formulation: All assessment and education complete, DC therapy    Frequency     Barriers to discharge        Co-evaluation               AM-PAC PT "6 Clicks" Mobility  Outcome Measure Help needed turning from your back to your side while in a flat bed without using bedrails?: None Help needed moving from lying on your back to sitting on the side of a flat bed without using bedrails?: None Help needed moving to and from a bed to a chair (including a wheelchair)?: None Help needed standing up from a chair using your arms (e.g., wheelchair or bedside chair)?: None Help needed to walk in hospital room?: None Help needed climbing 3-5 steps with a railing? : None 6 Click Score: 24    End of Session   Activity Tolerance: Patient tolerated treatment well Patient left: in bed;with call bell/phone within reach Nurse Communication: Mobility status PT Visit Diagnosis: Unsteadiness on feet (R26.81)    Time: 0175-1025 PT Time Calculation (min) (ACUTE ONLY): 34 min   Charges:   PT Evaluation $PT Eval Moderate Complexity: 1 Mod PT Treatments $Gait Training: 8-22 mins        [place]  Pager [phone] Office [place] 05/10/2019, 10:32 AM

## 2019-05-10 NOTE — Progress Notes (Signed)
Pt admitted from ED with stroke diagnosis, pt alert but still forgetful, c/o of slight abdominal pain, settled in bed with call light within pt's reach, tele monitor put and verified on pt, was however reassured, safety concern addressed, v/s stable. Carolyn Mclaughlin, Carolyn Mclaughlin

## 2019-05-10 NOTE — Progress Notes (Signed)
  Speech Language Pathology Treatment: Cognitive-Linquistic  Patient Details Name: Carolyn Mclaughlin MRN: [id number] DOB: 04-25-1971 Today's Date: 05/10/2019 Time: 7829-5621 SLP Time Calculation (min) (ACUTE ONLY): 26 min  Assessment / Plan / Recommendation Clinical Impression  Pt was seen for cognitive-linguistic treatment and was cooperative throughout the session. She achieved 60% accuracy with time management problems increasing to 100% accuracy with min-mod cues. She recalled 4 unrelated items with 80% accuracy increasing to 100% with min. cues. With 5 unrelated items she achieved 60% accuracy increasing to 100% accuracy with mod cues. She recalled objective information from recorded voice mails with 50% accuracy increasing to 87% accuracy with min-mod cues. She completed a mental manipulation 3-word sequencing task with 60% accuracy increasing to 100% with mod-max cues. SLP will continue to follow pt.     HPI HPI: Pt is a 48 y.o. female with medical history significant of incarcerated epigastric hernia s/p repair x2 weeks ago. Starting on Thursday 05/05/19 she began to have new memory difficulty. Symptoms have persisted since onset without associated limb weakness, facial droop, vision loss, difficulty walking, aphasia nor dysarthria. 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      SLP Plan  Continue with current plan of care  Patient needs continued Speech Lanaguage Pathology Services    Recommendations                   Follow up Recommendations: Outpatient SLP SLP Visit Diagnosis: Cognitive communication deficit (H08.657) Plan: Continue with current plan of care       Keandrea Tapley I. Hardin Negus, [place], [place] Office number [phone] Pager [place] 05/10/2019, 1:44 PM

## 2019-05-10 NOTE — Progress Notes (Signed)
  Echocardiogram 2D Echocardiogram has been performed.  Jennette Dubin 05/10/2019, 11:48 AM

## 2019-05-10 NOTE — ED Notes (Signed)
Patient transported to X-ray 

## 2019-05-10 NOTE — ED Notes (Signed)
ED TO INPATIENT HANDOFF REPORT  ED Nurse Name and Phone #: [phone]  S Name/Age/Gender Carolyn Mclaughlin 48 y.o. female Room/Bed: 028C/028C  Code Status   Code Status: Prior  Home/SNF/Other Home Patient oriented to: self, place and situation Is this baseline? Yes   Triage Complete: Triage complete  Chief Complaint Memory loss  Triage Note Pt in with daughter reporting memory loss in patient for the last few days, pt answering questions appropriately in triage, went to urgent care prior to here and was not answering orientation questions appropriately, denies weakness   Allergies No Known Allergies  Level of Care/Admitting Diagnosis ED Disposition    ED Disposition Condition Comment   Admit  The patient appears reasonably stabilized for admission considering the current resources, flow, and capabilities available in the ED at this time, and I doubt any other [facility] requiring further screening and/or treatment in the ED prior to admission is  present.       B Medical/Surgery History Past Medical History:  Diagnosis Date  . Headache   . Incarcerated epigastric hernia 04/26/2019  . Umbilical hernia 1/54/0086   Past Surgical History:  Procedure Laterality Date  . NO PAST SURGERIES    . VENTRAL HERNIA REPAIR N/A 04/26/2019   Procedure: LAPRASCOPIC REPAIR OF INCARCERATED EPIGASTRIC HERNIA WITH MESH;  Surgeon: Fanny Skates, MD;  Location: [facility];  Service: General;  Laterality: N/A;     A IV Location/Drains/Wounds Patient Lines/Drains/Airways Status   Active Line/Drains/Airways    Name:   Placement date:   Placement time:   Site:   Days:   Peripheral IV 05/10/19 Left Antecubital   05/10/19    0027    Antecubital   less than 1   Incision (Closed) 04/26/19 Abdomen   04/26/19    1135     14          Intake/Output Last 24 hours No intake or output data in the 24 hours ending 05/10/19 0037  Labs/Imaging Results for orders placed or performed during the hospital  encounter of 05/09/19 (from the past 48 hour(s))  Protime-INR     Status: None   Collection Time: 05/09/19  4:11 PM  Result Value Ref Range   Prothrombin Time 12.9 11.4 - 15.2 seconds   INR 1.0 0.8 - 1.2    Comment: (NOTE) INR goal varies based on device and disease states. Performed at [facility], [place] [address] [place], [place] [address]   APTT     Status: None   Collection Time: 05/09/19  4:11 PM  Result Value Ref Range   aPTT 33 24 - 36 seconds    Comment: Performed at [facility] [address], [place], [place] [address]  CBC     Status: None   Collection Time: 05/09/19  4:11 PM  Result Value Ref Range   WBC 6.4 4.0 - 10.5 K/uL   RBC 4.04 3.87 - 5.11 MIL/uL   Hemoglobin 12.1 12.0 - 15.0 g/dL   HCT 37.2 36.0 - 46.0 %   MCV 92.1 80.0 - 100.0 fL   MCH 30.0 26.0 - 34.0 pg   MCHC 32.5 30.0 - 36.0 g/dL   RDW 11.8 11.5 - 15.5 %   Platelets 356 150 - 400 K/uL   nRBC 0.0 0.0 - 0.2 %    Comment: Performed at [facility], [place] [address] [place], [place] [address]  Differential     Status: None   Collection Time:  05/09/19  4:11 PM  Result Value Ref Range   Neutrophils Relative % 57 %   Neutro Abs 3.7 1.7 - 7.7 K/uL   Lymphocytes Relative 30 %   Lymphs Abs 1.9 0.7 - 4.0 K/uL   Monocytes Relative 9 %   Monocytes Absolute 0.5 0.1 - 1.0 K/uL   Eosinophils Relative 3 %   Eosinophils Absolute 0.2 0.0 - 0.5 K/uL   Basophils Relative 1 %   Basophils Absolute 0.0 0.0 - 0.1 K/uL   Immature Granulocytes 0 %   Abs Immature Granulocytes 0.02 0.00 - 0.07 K/uL    Comment: Performed at [facility], [address] [place], [place] [address]  Comprehensive metabolic panel     Status: Abnormal   Collection Time: 05/09/19  4:11 PM  Result Value Ref Range   Sodium 138 135 - 145 mmol/L   Potassium 3.5 3.5 - 5.1 mmol/L   Chloride 102 98 - 111 mmol/L   CO2 25 22 - 32 mmol/L   Glucose, Bld 105 (H) 70 - 99 mg/dL   BUN 8 6 - 20 mg/dL   Creatinine, Ser 9.140.85 0.44  - 1.00 mg/dL   Calcium 9.4 8.9 - 78.210.3 mg/dL   Total Protein 7.4 6.5 - 8.1 g/dL   Albumin 3.7 3.5 - 5.0 g/dL   AST 22 15 - 41 U/L   ALT 51 (H) 0 - 44 U/L   Alkaline Phosphatase 158 (H) 38 - 126 U/L   Total Bilirubin 0.5 0.3 - 1.2 mg/dL   GFR calc non Af Amer >60 >60 mL/min   GFR calc Af Amer >60 >60 mL/min   Anion gap 11 5 - 15    Comment: Performed at [facility], [address] [place], [place] [address]  I-Stat beta hCG blood, ED     Status: None   Collection Time: 05/09/19  4:28 PM  Result Value Ref Range   I-stat hCG, quantitative <5.0 <5 mIU/mL   Comment 3            Comment:   GEST. AGE      CONC.  (mIU/mL)   <=1 WEEK        5 - 50     2 WEEKS       50 - 500     3 WEEKS       100 - 10,000     4 WEEKS     1,000 - 30,000        FEMALE AND NON-PREGNANT FEMALE:     LESS THAN 5 mIU/mL   I-stat chem 8, ED     Status: Abnormal   Collection Time: 05/09/19  4:30 PM  Result Value Ref Range   Sodium 137 135 - 145 mmol/L   Potassium 3.6 3.5 - 5.1 mmol/L   Chloride 102 98 - 111 mmol/L   BUN 10 6 - 20 mg/dL   Creatinine, Ser 3.080.80 0.44 - 1.00 mg/dL   Glucose, Bld 657102 (H) 70 - 99 mg/dL   Calcium, Ion 8.461.19 9.621.15 - 1.40 mmol/L   TCO2 28 22 - 32 mmol/L   Hemoglobin 12.2 12.0 - 15.0 g/dL   HCT 95.236.0 84.136.0 - 32.446.0 %  CBG monitoring, ED     Status: Abnormal   Collection Time: 05/09/19 11:49 PM  Result Value Ref Range   Glucose-Capillary 104 (H) 70 - 99 mg/dL   Ct Head Wo Contrast  Result Date: 05/09/2019 CLINICAL DATA:  Pt in with daughter  reporting memory loss in patient for the last few days EXAM: CT HEAD WITHOUT CONTRAST TECHNIQUE: Contiguous axial images were obtained from the base of the skull through the vertex without intravenous contrast. COMPARISON:  None. FINDINGS: Brain: Hypoattenuation in the left basal ganglia and thalamus, possibly subacute infarct. No acute hemorrhage. No hydrocephalus, extra-axial collection, mass lesion, or mass effect. Vascular: No hyperdense vessel  or unexpected calcification. Skull: Normal. Negative for fracture or focal lesion. Sinuses/Orbits: No acute finding. Other: None IMPRESSION: Hypoattenuation in the left basal ganglia and thalamus, possibly subacute infarct. No acute hemorrhage. Electronically Signed   By: Corlis Leak  Hassell M.D.   On: 05/09/2019 18:57    Pending Labs Unresulted Labs (From admission, onward)    Start     Ordered   05/10/19 0015  SARS Coronavirus 2 (CEPHEID - Performed in [facility] lab), [facility] Order  Once,   R    Question:  Rule Out  Answer:  Yes   05/10/19 0014          Vitals/Pain Today's Vitals   05/09/19 2236 05/09/19 2258 05/10/19 0027 05/10/19 0028  BP: (!) 173/99   (!) 169/103  Pulse: 78  82 80  Resp: 15  19 12   Temp: 98.1 F (36.7 C)     TempSrc: Oral     SpO2: 100%  98% 98%  PainSc:  0-No pain      Isolation Precautions No active isolations  Medications Medications  LORazepam (ATIVAN) injection 1 mg (has no administration in time range)  sodium chloride flush (NS) 0.9 % injection 3 mL (3 mLs Intravenous Given 05/10/19 0027)    Mobility walks Low fall risk   Focused Assessments Neuro Assessment Handoff:  Swallow screen pass? Yes    NIH Stroke Scale ( + Modified Stroke Scale Criteria)  Level of Consciousness (1a.)   : Alert, keenly responsive LOC Questions (1b. )   +: Answers both questions correctly LOC Commands (1c. )   + : Performs both tasks correctly Best Gaze (2. )  +: Normal Visual (3. )  +: No visual loss Facial Palsy (4. )    : Normal symmetrical movements Motor Arm, Left (5a. )   +: No drift Motor Arm, Right (5b. )   +: No drift Motor Leg, Left (6a. )   +: No drift Motor Leg, Right (6b. )   +: No drift Limb Ataxia (7. ): Absent Sensory (8. )   +: Normal, no sensory loss Best Language (9. )   +: No aphasia Dysarthria (10. ): Normal Extinction/Inattention (11.)   +: No Abnormality Modified SS Total  +: 0 Complete NIHSS TOTAL: 0 Last date known well:  05/07/19   Neuro Assessment: Exceptions to WDL Neuro Checks:      Last Documented NIHSS Modified Score: 0 (05/09/19 2300) Has TPA been given? No If patient is a Neuro Trauma and patient is going to OR before floor call report to 4N Charge nurse: [phone] or [phone]     R Recommendations: See Admitting Provider Note  Report given to:   Additional Notes: N/A

## 2019-05-10 NOTE — ED Notes (Signed)
Breakfast tray ordered 

## 2019-05-11 ENCOUNTER — Inpatient Hospital Stay (HOSPITAL_COMMUNITY): Payer: BC Managed Care – PPO

## 2019-05-11 DIAGNOSIS — I639 Cerebral infarction, unspecified: Secondary | ICD-10-CM

## 2019-05-11 LAB — COMPREHENSIVE METABOLIC PANEL
ALT: 32 U/L (ref 0–44)
AST: 15 U/L (ref 15–41)
Albumin: 3.4 g/dL — ABNORMAL LOW (ref 3.5–5.0)
Alkaline Phosphatase: 136 U/L — ABNORMAL HIGH (ref 38–126)
Anion gap: 10 (ref 5–15)
BUN: 13 mg/dL (ref 6–20)
CO2: 24 mmol/L (ref 22–32)
Calcium: 9.6 mg/dL (ref 8.9–10.3)
Chloride: 104 mmol/L (ref 98–111)
Creatinine, Ser: 0.85 mg/dL (ref 0.44–1.00)
GFR calc Af Amer: >60 mL/min (ref >60)
GFR calc non Af Amer: >60 mL/min (ref >60)
Glucose, Bld: 106 mg/dL — ABNORMAL HIGH (ref 70–99)
Potassium: 3.6 mmol/L (ref 3.5–5.1)
Sodium: 138 mmol/L (ref 135–145)
Total Bilirubin: 0.9 mg/dL (ref 0.3–1.2)
Total Protein: 7 g/dL (ref 6.5–8.1)

## 2019-05-11 LAB — CBC
HCT: 36.5 % (ref 36.0–46.0)
Hemoglobin: 12.2 g/dL (ref 12.0–15.0)
MCH: 30 pg (ref 26.0–34.0)
MCHC: 33.4 g/dL (ref 30.0–36.0)
MCV: 89.7 fL (ref 80.0–100.0)
Platelets: 405 10*3/uL — ABNORMAL HIGH (ref 150–400)
RBC: 4.07 MIL/uL (ref 3.87–5.11)
RDW: 11.7 % (ref 11.5–15.5)
WBC: 5.5 10*3/uL (ref 4.0–10.5)
nRBC: 0 % (ref 0.0–0.2)

## 2019-05-11 LAB — SEDIMENTATION RATE: Sed Rate: 51 mm/hr — ABNORMAL HIGH (ref 0–22)

## 2019-05-11 LAB — MAGNESIUM: Magnesium: 2.2 mg/dL (ref 1.7–2.4)

## 2019-05-11 LAB — RPR: RPR Ser Ql: NONREACTIVE

## 2019-05-11 NOTE — Plan of Care (Signed)
  Problem: Education: Goal: Knowledge of patient specific risk factors addressed and post discharge goals established will improve Outcome: Progressing   Problem: Education: Goal: Knowledge of secondary prevention will improve Outcome: Progressing   Problem: Health Behavior/Discharge Planning: Goal: Ability to manage health-related needs will improve Outcome: Progressing

## 2019-05-11 NOTE — Progress Notes (Signed)
[name]  PROGRESS NOTE    Carolyn Mclaughlin  JOA:[id number] DOB: 1971-05-20 DOA: 05/09/2019 PCP: [place], [place]   Brief Narrative:   [name] a 48 y.o.femalewith medical history significant ofincarcerated epigastric hernia s/p repair x2 weeks ago.  Starting on Thursday (5 days ago) she began to have new memory difficulty. Symptoms have persisted since onset. No associated limb weakness, facial droop, vision loss, difficulty walking, aphasia nor dysarthria.   Assessment & Plan:   Principal Problem:   Acute ischemic stroke ([place]) Active Problems:   Stroke ([place])   Acute ischemic stroke     - L thalamic infarct; thought 2/2 to small vessel disease, but concern for embolic source given recent surgery/post op state     - MRI with 2 cm subacute ischemic nonhemorrhagic infarct involving the ventromedial L thalamus, no LVO, no hemodynamically significant or correctable stenosis     - Echo - EF 55-60% (see report)     - Carotid US with no evidence of stenosis in L or R ICA, bilateral vertebral arteries with antegrade flow (pending final read)     - transcranial doppler with bubbles: Positive TCD Bubble study with valsalva only indicative of a small right to left shunt     - BLE dopplers: There is no evidence of deep vein thrombosis in the lower extremities     - PT/OT/SLP - recommending outpatient SLP      - Neurology c/s, appreciate recommendations     - Pending hypercoagulable panel     - ASA/plavix per neurology     - A1c 5.8, LDL 117  ? Undiagnosed chronic HTN      - Holding off on starting therapy and allowing permissive HTN in setting of acute stroke.  Mildly elevated liver enzymes, alk phos      - down trending  Will need outpt SLP/cognitive therapy. Lives at home alone; will need family support initially at discharge. Have d/w daughter. Awaiting final neuro recs. Continue as above.   DVT prophylaxis: lovenox Code Status: FULL Family  Communication: spoke with family by phone   Disposition Plan: To home with outpt SLP   Consultants:   Neurology    Subjective: "I had a stoke? Really?"  Objective: Vitals:   05/11/19 0056 05/11/19 0412 05/11/19 0901 05/11/19 1214  BP: (!) 138/95 (!) 125/94 (!) 138/97 (!) 143/81  Pulse: 86 95 88 95  Resp: 17 16 18 18   Temp: 98.6 F (37 C) 98.6 F (37 C) 98.7 F (37.1 C) 98.2 F (36.8 C)  TempSrc: Oral Oral Oral Oral  SpO2: 99% 98% 99% 99%  Weight:      Height:       No intake or output data in the 24 hours ending 05/11/19 1337 Filed Weights   05/10/19 0554  Weight: 83.1 kg    Examination:  General: 48 y.o. female resting in bed in NAD Cardiovascular: RRR, +S1, S2, no m/g/r, equal pulses throughout Respiratory: CTABL, no w/r/r, normal WOB GI: BS+, NDNT, no masses noted, no organomegaly noted MSK: No e/c/c Skin: No rashes, bruises, ulcerations noted Neuro: A&O x 3, no focal deficits   Data Reviewed: I have personally reviewed following labs and imaging studies.  CBC: Recent Labs  Lab 05/09/19 1611 05/09/19 1630 05/11/19 0422  WBC 6.4  --  5.5  NEUTROABS 3.7  --   --   HGB 12.1 12.2 12.2  HCT 37.2 36.0 36.5  MCV 92.1  --  89.7  PLT 356  --  960*   Basic Metabolic Panel: Recent Labs  Lab 05/09/19 1611 05/09/19 1630 05/11/19 0422  NA 138 137 138  K 3.5 3.6 3.6  CL 102 102 104  CO2 25  --  24  GLUCOSE 105* 102* 106*  BUN 8 10 13   CREATININE 0.85 0.80 0.85  CALCIUM 9.4  --  9.6  MG  --   --  2.2   GFR: Estimated Creatinine Clearance: 80.9 mL/min (by C-G formula based on SCr of 0.85 mg/dL). Liver Function Tests: Recent Labs  Lab 05/09/19 1611 05/11/19 0422  AST 22 15  ALT 51* 32  ALKPHOS 158* 136*  BILITOT 0.5 0.9  PROT 7.4 7.0  ALBUMIN 3.7 3.4*   No results for input(s): LIPASE, AMYLASE in the last 168 hours. No results for input(s): AMMONIA in the last 168 hours. Coagulation Profile: Recent Labs  Lab 05/09/19 1611  INR 1.0    Cardiac Enzymes: Recent Labs  Lab 05/10/19 0341  TROPONINI <0.03   BNP (last 3 results) No results for input(s): PROBNP in the last 8760 hours. HbA1C: Recent Labs    05/10/19 0341  HGBA1C 5.8*   CBG: Recent Labs  Lab 05/09/19 2349  GLUCAP 104*   Lipid Profile: Recent Labs    05/10/19 0341  CHOL 173  HDL 46  LDLCALC 117*  TRIG 52  CHOLHDL 3.8   Thyroid Function Tests: No results for input(s): TSH, T4TOTAL, FREET4, T3FREE, THYROIDAB in the last 72 hours. Anemia Panel: No results for input(s): VITAMINB12, FOLATE, FERRITIN, TIBC, IRON, RETICCTPCT in the last 72 hours. Sepsis Labs: No results for input(s): PROCALCITON, LATICACIDVEN in the last 168 hours.  Recent Results (from the past 240 hour(s))  SARS Coronavirus 2 (CEPHEID - Performed in [facility] hospital lab), Hosp Order     Status: None   Collection Time: 05/10/19 12:29 AM  Result Value Ref Range Status   SARS Coronavirus 2 NEGATIVE NEGATIVE Final    Comment: (NOTE) If result is NEGATIVE SARS-CoV-2 target nucleic acids are NOT DETECTED. The SARS-CoV-2 RNA is generally detectable in upper and lower  respiratory specimens during the acute phase of infection. The lowest  concentration of SARS-CoV-2 viral copies this assay can detect is 250  copies / mL. A negative result does not preclude SARS-CoV-2 infection  and should not be used as the sole basis for treatment or other  patient management decisions.  A negative result may occur with  improper specimen collection / handling, submission of specimen other  than nasopharyngeal swab, presence of viral mutation(s) within the  areas targeted by this assay, and inadequate number of viral copies  (<250 copies / mL). A negative result must be combined with clinical  observations, patient history, and epidemiological information. If result is POSITIVE SARS-CoV-2 target nucleic acids are DETECTED. The SARS-CoV-2 RNA is generally detectable in upper and lower   respiratory specimens dur ing the acute phase of infection.  Positive  results are indicative of active infection with SARS-CoV-2.  Clinical  correlation with patient history and other diagnostic information is  necessary to determine patient infection status.  Positive results do  not rule out bacterial infection or co-infection with other viruses. If result is PRESUMPTIVE POSTIVE SARS-CoV-2 nucleic acids MAY BE PRESENT.   A presumptive positive result was obtained on the submitted specimen  and confirmed on repeat testing.  While 2019 novel coronavirus  (SARS-CoV-2) nucleic acids may be present in the submitted sample  additional confirmatory testing may be necessary for epidemiological  and / or clinical management purposes  to differentiate between  SARS-CoV-2 and other Sarbecovirus currently known to infect humans.  If clinically indicated additional testing with an alternate test  methodology [phone]) is advised. The SARS-CoV-2 RNA is generally  detectable in upper and lower respiratory sp ecimens during the acute  phase of infection. The expected result is Negative. Fact Sheet for Patients:  StrictlyIdeas.no Fact Sheet for Healthcare Providers: BankingDealers.co.za This test is not yet approved or cleared by the [place] FDA and has been authorized for detection and/or diagnosis of SARS-CoV-2 by FDA under an Emergency Use Authorization (EUA).  This EUA will remain in effect (meaning this test can be used) for the duration of the COVID-19 declaration under Section 564(b)(1) of the Act, 21 U.S.C. section 360bbb-3(b)(1), unless the authorization is terminated or revoked sooner. Performed at [facility], [place] [address], [place], [place] [address]          Radiology Studies: Dg Chest 2 View  Result Date: 05/10/2019 CLINICAL DATA:  Initial evaluation for acute stroke. EXAM: CHEST - 2 VIEW COMPARISON:  Prior  radiograph 02/18/2012. FINDINGS: The cardiac and mediastinal silhouettes are stable in size and contour, and remain within normal limits. The lungs are normally inflated. No airspace consolidation, pleural effusion, or pulmonary edema is identified. There is no pneumothorax. No acute osseous abnormality identified. IMPRESSION: No active cardiopulmonary disease. Electronically Signed   By: Jeannine Boga M.D.   On: 05/10/2019 01:39   Ct Head Wo Contrast  Result Date: 05/09/2019 CLINICAL DATA:  Pt in with daughter reporting memory loss in patient for the last few days EXAM: CT HEAD WITHOUT CONTRAST TECHNIQUE: Contiguous axial images were obtained from the base of the skull through the vertex without intravenous contrast. COMPARISON:  None. FINDINGS: Brain: Hypoattenuation in the left basal ganglia and thalamus, possibly subacute infarct. No acute hemorrhage. No hydrocephalus, extra-axial collection, mass lesion, or mass effect. Vascular: No hyperdense vessel or unexpected calcification. Skull: Normal. Negative for fracture or focal lesion. Sinuses/Orbits: No acute finding. Other: None IMPRESSION: Hypoattenuation in the left basal ganglia and thalamus, possibly subacute infarct. No acute hemorrhage. Electronically Signed   By: Lucrezia Europe M.D.   On: 05/09/2019 18:57   Mr Virgel Paling FS Contrast  Result Date: 05/10/2019 CLINICAL DATA:  Initial evaluation for acute stroke, subacute stroke on prior CT. EXAM: MRI HEAD WITHOUT CONTRAST MRA HEAD WITHOUT CONTRAST TECHNIQUE: Multiplanar, multiecho pulse sequences of the brain and surrounding structures were obtained without intravenous contrast. Angiographic images of the head were obtained using MRA technique without contrast. COMPARISON:  Prior CT from 05/09/2019 FINDINGS: MRI HEAD FINDINGS Brain: Cerebral volume within normal limits for age. No significant cerebral white matter changes. 2 cm subacute ischemic nonhemorrhagic infarcts seen involving the ventral medial  left thalamus, corresponding with abnormality on prior CT. Associated T2/FLAIR signal abnormality without significant regional mass effect or edema. No other evidence for acute or subacute ischemia. Gray-white matter differentiation otherwise maintained. No other areas of remote or chronic infarction. No foci of susceptibility artifact to suggest acute or chronic intracranial hemorrhage. No mass lesion, midline shift or mass effect. Ventricles normal size without hydrocephalus. No extra-axial fluid collection. Pituitary gland suprasellar region normal. Midline structures intact and normal. Vascular: Major intracranial vascular flow voids are well maintained. Skull and upper cervical spine: Craniocervical junction normal. Upper cervical spine within normal limits. No focal marrow replacing lesion. No scalp soft tissue abnormality. Sinuses/Orbits: Globes and orbital soft tissues demonstrate no acute finding.  Mild axial myopia noted. Paranasal sinuses are largely clear. No mastoid effusion. Inner ear structures normal. Other: None. MRA HEAD FINDINGS ANTERIOR CIRCULATION: Distal cervical segments of the internal carotid arteries are patent with symmetric antegrade flow. Distal cervical right ICA mildly tortuous. Petrous, cavernous, and supraclinoid segments patent without hemodynamically significant stenosis. A1 segments patent bilaterally. Normal anterior communicating artery. Anterior cerebral arteries widely patent to their distal aspects. No M1 stenosis or occlusion. Normal MCA bifurcations. Distal MCA branches well perfused and symmetric. POSTERIOR CIRCULATION: Vertebral arteries largely code dominant and widely patent to the vertebrobasilar junction. Posterior inferior cerebral arteries patent bilaterally. Basilar patent to its distal aspect without stenosis. 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 Superior cerebral arteries patent bilaterally. PCAs supplied via hypoplastic P1 segments as well as  prominent bilateral posterior communicating arteries. PCAs well perfused to their distal aspects without stenosis. No intracranial aneurysm or other vascular abnormality. IMPRESSION: MRI HEAD IMPRESSION: 1. 2 cm subacute ischemic nonhemorrhagic infarct involving the ventromedial left thalamus. 2. Otherwise normal brain MRI for age. MRA HEAD IMPRESSION: Normal intracranial MRA. No large vessel occlusion. No hemodynamically significant or correctable stenosis. Electronically Signed   By: Jeannine Boga M.D.   On: 05/10/2019 03:04   Mr Brain Wo Contrast  Result Date: 05/10/2019 CLINICAL DATA:  Initial evaluation for acute stroke, subacute stroke on prior CT. EXAM: MRI HEAD WITHOUT CONTRAST MRA HEAD WITHOUT CONTRAST TECHNIQUE: Multiplanar, multiecho pulse sequences of the brain and surrounding structures were obtained without intravenous contrast. Angiographic images of the head were obtained using MRA technique without contrast. COMPARISON:  Prior CT from 05/09/2019 FINDINGS: MRI HEAD FINDINGS Brain: Cerebral volume within normal limits for age. No significant cerebral white matter changes. 2 cm subacute ischemic nonhemorrhagic infarcts seen involving the ventral medial left thalamus, corresponding with abnormality on prior CT. Associated T2/FLAIR signal abnormality without significant regional mass effect or edema. No other evidence for acute or subacute ischemia. Gray-white matter differentiation otherwise maintained. No other areas of remote or chronic infarction. No foci of susceptibility artifact to suggest acute or chronic intracranial hemorrhage. No mass lesion, midline shift or mass effect. Ventricles normal size without hydrocephalus. No extra-axial fluid collection. Pituitary gland suprasellar region normal. Midline structures intact and normal. Vascular: Major intracranial vascular flow voids are well maintained. Skull and upper cervical spine: Craniocervical junction normal. Upper cervical spine  within normal limits. No focal marrow replacing lesion. No scalp soft tissue abnormality. Sinuses/Orbits: Globes and orbital soft tissues demonstrate no acute finding. Mild axial myopia noted. Paranasal sinuses are largely clear. No mastoid effusion. Inner ear structures normal. Other: None. MRA HEAD FINDINGS ANTERIOR CIRCULATION: Distal cervical segments of the internal carotid arteries are patent with symmetric antegrade flow. Distal cervical right ICA mildly tortuous. Petrous, cavernous, and supraclinoid segments patent without hemodynamically significant stenosis. A1 segments patent bilaterally. Normal anterior communicating artery. Anterior cerebral arteries widely patent to their distal aspects. No M1 stenosis or occlusion. Normal MCA bifurcations. Distal MCA branches well perfused and symmetric. POSTERIOR CIRCULATION: Vertebral arteries largely code dominant and widely patent to the vertebrobasilar junction. Posterior inferior cerebral arteries patent bilaterally. Basilar patent to its distal aspect without stenosis. 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 Superior cerebral arteries patent bilaterally. PCAs supplied via hypoplastic P1 segments as well as prominent bilateral posterior communicating arteries. PCAs well perfused to their distal aspects without stenosis. No intracranial aneurysm or other vascular abnormality. IMPRESSION: MRI HEAD IMPRESSION: 1. 2 cm subacute ischemic nonhemorrhagic infarct involving the ventromedial left thalamus.  2. Otherwise normal brain MRI for age. MRA HEAD IMPRESSION: Normal intracranial MRA. No large vessel occlusion. No hemodynamically significant or correctable stenosis. Electronically Signed   By: Jeannine Boga M.D.   On: 05/10/2019 03:04   Vas [place] Transcranial Doppler W Bubbles  Result Date: 05/11/2019  Transcranial Doppler with Bubble Indications: Stroke. Performing Technologist: Abram Sander RVS  Examination Guidelines: A complete evaluation  includes B-mode imaging, spectral Doppler, color Doppler, and power Doppler as needed of all accessible portions of each vessel. Bilateral testing is considered an integral part of a complete examination. Limited examinations for reoccurring indications may be performed as noted.  Summary:  A vascular evaluation was performed. The right middle cerebral artery was studied. An IV was inserted into the patient's left forearm. Verbal informed consent was obtained.  HITS heard during valsalva PFO size: small Positive TCD Bubble study with valsalva only indicative of a small right to left shunt *See table(s) above for measurements and observations.  Diagnosing physician: Antony Contras MD Electronically signed by Antony Contras MD on 05/11/2019 at 12:47:30 PM.    Final    Vas US Carotid (at [place] Only)  Result Date: 05/10/2019 Carotid Arterial Duplex Study Indications: CVA and Memory Dysfunction. Performing Technologist: Toma Copier RVS  Examination Guidelines: A complete evaluation includes B-mode imaging, spectral Doppler, color Doppler, and power Doppler as needed of all accessible portions of each vessel. Bilateral testing is considered an integral part of a complete examination. Limited examinations for reoccurring indications may be performed as noted.  Right Carotid Findings: +----------+--------+--------+--------+--------+-------------------------+             PSV cm/s EDV cm/s Stenosis Describe Comments                   +----------+--------+--------+--------+--------+-------------------------+  CCA Prox   48       11                         mild intimal wall changes  +----------+--------+--------+--------+--------+-------------------------+  CCA Distal 48       17                                                    +----------+--------+--------+--------+--------+-------------------------+  ICA Prox   48       19                                                     +----------+--------+--------+--------+--------+-------------------------+  ICA Mid    67       25                                                    +----------+--------+--------+--------+--------+-------------------------+  ICA Distal 45       21                         tortuous                   +----------+--------+--------+--------+--------+-------------------------+  ECA        64       15                                                    +----------+--------+--------+--------+--------+-------------------------+ +----------+--------+-------+--------+-------------------+             PSV cm/s EDV cms Describe Arm Pressure (mmHG)  +----------+--------+-------+--------+-------------------+  Subclavian 69                                             +----------+--------+-------+--------+-------------------+ +---------+--------+--+--------+--+---------+  Vertebral PSV cm/s 34 EDV cm/s 10 Antegrade  +---------+--------+--+--------+--+---------+  Left Carotid Findings: +----------+--------+--------+--------+--------+-------------------------+             PSV cm/s EDV cm/s Stenosis Describe Comments                   +----------+--------+--------+--------+--------+-------------------------+  CCA Prox   82       71                         mild intimal wall changes  +----------+--------+--------+--------+--------+-------------------------+  CCA Distal 53       17                         mild intimal wall changes  +----------+--------+--------+--------+--------+-------------------------+  ICA Prox   51       23                                                    +----------+--------+--------+--------+--------+-------------------------+  ICA Mid    85       38                         tortuous                   +----------+--------+--------+--------+--------+-------------------------+  ICA Distal 86       35                         tortuous                    +----------+--------+--------+--------+--------+-------------------------+  ECA        43       15                                                    +----------+--------+--------+--------+--------+-------------------------+ +----------+--------+--------+--------+-------------------+  Subclavian PSV cm/s EDV cm/s Describe Arm Pressure (mmHG)  +----------+--------+--------+--------+-------------------+             85                                              +----------+--------+--------+--------+-------------------+ +---------+--------+--+--------+--+---------+  Vertebral PSV cm/s 47 EDV cm/s 18 Antegrade  +---------+--------+--+--------+--+---------+  Summary: Right Carotid: There is no evidence of stenosis in the right ICA. Left Carotid: There is no evidence of stenosis in the left ICA. Vertebrals:  Bilateral vertebral arteries demonstrate antegrade flow. Subclavians: Normal flow hemodynamics were seen in bilateral subclavian              arteries. *See table(s) above for measurements and observations.     Preliminary    Vas [place] Lower Extremity Venous (dvt)  Result Date: 05/11/2019  Lower Venous Study Indications: Stroke.  Performing Technologist: Abram Sander RVS  Examination Guidelines: A complete evaluation includes B-mode imaging, spectral Doppler, color Doppler, and power Doppler as needed of all accessible portions of each vessel. Bilateral testing is considered an integral part of a complete examination. Limited examinations for reoccurring indications may be performed as noted.  +---------+---------------+---------+-----------+----------+--------------+  RIGHT     Compressibility Phasicity Spontaneity Properties Summary         +---------+---------------+---------+-----------+----------+--------------+  CFV       Full            Yes       Yes                                    +---------+---------------+---------+-----------+----------+--------------+  SFJ       Full                                                              +---------+---------------+---------+-----------+----------+--------------+  FV Prox   Full                                                             +---------+---------------+---------+-----------+----------+--------------+  FV Mid    Full                                                             +---------+---------------+---------+-----------+----------+--------------+  FV Distal Full                                                             +---------+---------------+---------+-----------+----------+--------------+  PFV       Full                                                             +---------+---------------+---------+-----------+----------+--------------+  POP       Full            Yes  Yes                                    +---------+---------------+---------+-----------+----------+--------------+  PTV       Full                                                             +---------+---------------+---------+-----------+----------+--------------+  PERO                                                       Not visualized  +---------+---------------+---------+-----------+----------+--------------+   +---------+---------------+---------+-----------+----------+-------+  LEFT      Compressibility Phasicity Spontaneity Properties Summary  +---------+---------------+---------+-----------+----------+-------+  CFV       Full            Yes       Yes                             +---------+---------------+---------+-----------+----------+-------+  SFJ       Full                                                      +---------+---------------+---------+-----------+----------+-------+  FV Prox   Full                                                      +---------+---------------+---------+-----------+----------+-------+  FV Mid    Full                                                      +---------+---------------+---------+-----------+----------+-------+  FV Distal Full                                                       +---------+---------------+---------+-----------+----------+-------+  PFV       Full                                                      +---------+---------------+---------+-----------+----------+-------+  POP       Full            Yes       Yes                             +---------+---------------+---------+-----------+----------+-------+  PTV       Full                                                      +---------+---------------+---------+-----------+----------+-------+  PERO      Full                                                      +---------+---------------+---------+-----------+----------+-------+     Summary: Right: There is no evidence of deep vein thrombosis in the lower extremity. No cystic structure found in the popliteal fossa. Left: There is no evidence of deep vein thrombosis in the lower extremity. No cystic structure found in the popliteal fossa.  *See table(s) above for measurements and observations.    Preliminary         Scheduled Meds:  aspirin  81 mg Oral Daily   atorvastatin  80 mg Oral q1800   clopidogrel  75 mg Oral Daily   enoxaparin (LOVENOX) injection  40 mg Subcutaneous Q24H   Continuous Infusions:   LOS: 1 day    Time spent: 25 minutes spent in the coordination of care today    Jonnie Finner, DO [facility] Pager [phone]  If 7PM-7AM, please contact night-coverage [email or web address] Password [email or web address] 05/11/2019, 1:37 PM

## 2019-05-11 NOTE — Progress Notes (Signed)
TCD bubble study has been completed.   Preliminary results in CV Proc.   Abram Sander 05/11/2019 11:39 AM

## 2019-05-11 NOTE — Progress Notes (Signed)
Lower extremity venous has been completed.   Preliminary results in CV Proc.   Abram Sander 05/11/2019 10:03 AM

## 2019-05-11 NOTE — Progress Notes (Signed)
STROKE TEAM PROGRESS NOTE   INTERVAL HISTORY He continues to have short-term memory difficulties.  No other changes.  She had transcranial Doppler bubble study performed by me personally at the bedside which is positive for a small right-to-left shunt mostly with Valsalva.  Lower extremity venous Doppler was negative for DVT.  Vitals:   05/11/19 0056 05/11/19 0412 05/11/19 0901 05/11/19 1214  BP: (!) 138/95 (!) 125/94 (!) 138/97 (!) 143/81  Pulse: 86 95 88 95  Resp: 17 16 18 18   Temp: 98.6 F (37 C) 98.6 F (37 C) 98.7 F (37.1 C) 98.2 F (36.8 C)  TempSrc: Oral Oral Oral Oral  SpO2: 99% 98% 99% 99%  Weight:      Height:        CBC:  Recent Labs  Lab 05/09/19 1611 05/09/19 1630 05/11/19 0422  WBC 6.4  --  5.5  NEUTROABS 3.7  --   --   HGB 12.1 12.2 12.2  HCT 37.2 36.0 36.5  MCV 92.1  --  89.7  PLT 356  --  405*    Basic Metabolic Panel:  Recent Labs  Lab 05/09/19 1611 05/09/19 1630 05/11/19 0422  NA 138 137 138  K 3.5 3.6 3.6  CL 102 102 104  CO2 25  --  24  GLUCOSE 105* 102* 106*  BUN 8 10 13   CREATININE 0.85 0.80 0.85  CALCIUM 9.4  --  9.6  MG  --   --  2.2   Lipid Panel:     Component Value Date/Time   CHOL 173 05/10/2019 0341   TRIG 52 05/10/2019 0341   HDL 46 05/10/2019 0341   CHOLHDL 3.8 05/10/2019 0341   VLDL 10 05/10/2019 0341   LDLCALC 117 (H) 05/10/2019 0341   HgbA1c:  Lab Results  Component Value Date   HGBA1C 5.8 (H) 05/10/2019   Urine Drug Screen:     Component Value Date/Time   LABOPIA NONE DETECTED 05/10/2019 0837   COCAINSCRNUR NONE DETECTED 05/10/2019 0837   LABBENZ NONE DETECTED 05/10/2019 0837   AMPHETMU NONE DETECTED 05/10/2019 0837   THCU NONE DETECTED 05/10/2019 0837   LABBARB NONE DETECTED 05/10/2019 0837    Alcohol Level No results found for: [facility]  IMAGING Dg Chest 2 View  Result Date: 05/10/2019 CLINICAL DATA:  Initial evaluation for acute stroke. EXAM: CHEST - 2 VIEW COMPARISON:  Prior radiograph 02/18/2012.  FINDINGS: The cardiac and mediastinal silhouettes are stable in size and contour, and remain within normal limits. The lungs are normally inflated. No airspace consolidation, pleural effusion, or pulmonary edema is identified. There is no pneumothorax. No acute osseous abnormality identified. IMPRESSION: No active cardiopulmonary disease. Electronically Signed   By: Jeannine Boga M.D.   On: 05/10/2019 01:39   Ct Head Wo Contrast  Result Date: 05/09/2019 CLINICAL DATA:  Pt in with daughter reporting memory loss in patient for the last few days EXAM: CT HEAD WITHOUT CONTRAST TECHNIQUE: Contiguous axial images were obtained from the base of the skull through the vertex without intravenous contrast. COMPARISON:  None. FINDINGS: Brain: Hypoattenuation in the left basal ganglia and thalamus, possibly subacute infarct. No acute hemorrhage. No hydrocephalus, extra-axial collection, mass lesion, or mass effect. Vascular: No hyperdense vessel or unexpected calcification. Skull: Normal. Negative for fracture or focal lesion. Sinuses/Orbits: No acute finding. Other: None IMPRESSION: Hypoattenuation in the left basal ganglia and thalamus, possibly subacute infarct. No acute hemorrhage. Electronically Signed   By: Lucrezia Europe M.D.   On: 05/09/2019 18:57  Mr Virgel Paling HU Contrast  Result Date: 05/10/2019 CLINICAL DATA:  Initial evaluation for acute stroke, subacute stroke on prior CT. EXAM: MRI HEAD WITHOUT CONTRAST MRA HEAD WITHOUT CONTRAST TECHNIQUE: Multiplanar, multiecho pulse sequences of the brain and surrounding structures were obtained without intravenous contrast. Angiographic images of the head were obtained using MRA technique without contrast. COMPARISON:  Prior CT from 05/09/2019 FINDINGS: MRI HEAD FINDINGS Brain: Cerebral volume within normal limits for age. No significant cerebral white matter changes. 2 cm subacute ischemic nonhemorrhagic infarcts seen involving the ventral medial left thalamus,  corresponding with abnormality on prior CT. Associated T2/FLAIR signal abnormality without significant regional mass effect or edema. No other evidence for acute or subacute ischemia. Gray-white matter differentiation otherwise maintained. No other areas of remote or chronic infarction. No foci of susceptibility artifact to suggest acute or chronic intracranial hemorrhage. No mass lesion, midline shift or mass effect. Ventricles normal size without hydrocephalus. No extra-axial fluid collection. Pituitary gland suprasellar region normal. Midline structures intact and normal. Vascular: Major intracranial vascular flow voids are well maintained. Skull and upper cervical spine: Craniocervical junction normal. Upper cervical spine within normal limits. No focal marrow replacing lesion. No scalp soft tissue abnormality. Sinuses/Orbits: Globes and orbital soft tissues demonstrate no acute finding. Mild axial myopia noted. Paranasal sinuses are largely clear. No mastoid effusion. Inner ear structures normal. Other: None. MRA HEAD FINDINGS ANTERIOR CIRCULATION: Distal cervical segments of the internal carotid arteries are patent with symmetric antegrade flow. Distal cervical right ICA mildly tortuous. Petrous, cavernous, and supraclinoid segments patent without hemodynamically significant stenosis. A1 segments patent bilaterally. Normal anterior communicating artery. Anterior cerebral arteries widely patent to their distal aspects. No M1 stenosis or occlusion. Normal MCA bifurcations. Distal MCA branches well perfused and symmetric. POSTERIOR CIRCULATION: Vertebral arteries largely code dominant and widely patent to the vertebrobasilar junction. Posterior inferior cerebral arteries patent bilaterally. Basilar patent to its distal aspect without stenosis. 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 Superior cerebral arteries patent bilaterally. PCAs supplied via hypoplastic P1 segments as well as prominent  bilateral posterior communicating arteries. PCAs well perfused to their distal aspects without stenosis. No intracranial aneurysm or other vascular abnormality. IMPRESSION: MRI HEAD IMPRESSION: 1. 2 cm subacute ischemic nonhemorrhagic infarct involving the ventromedial left thalamus. 2. Otherwise normal brain MRI for age. MRA HEAD IMPRESSION: Normal intracranial MRA. No large vessel occlusion. No hemodynamically significant or correctable stenosis. Electronically Signed   By: Jeannine Boga M.D.   On: 05/10/2019 03:04   Mr Brain Wo Contrast  Result Date: 05/10/2019 CLINICAL DATA:  Initial evaluation for acute stroke, subacute stroke on prior CT. EXAM: MRI HEAD WITHOUT CONTRAST MRA HEAD WITHOUT CONTRAST TECHNIQUE: Multiplanar, multiecho pulse sequences of the brain and surrounding structures were obtained without intravenous contrast. Angiographic images of the head were obtained using MRA technique without contrast. COMPARISON:  Prior CT from 05/09/2019 FINDINGS: MRI HEAD FINDINGS Brain: Cerebral volume within normal limits for age. No significant cerebral white matter changes. 2 cm subacute ischemic nonhemorrhagic infarcts seen involving the ventral medial left thalamus, corresponding with abnormality on prior CT. Associated T2/FLAIR signal abnormality without significant regional mass effect or edema. No other evidence for acute or subacute ischemia. Gray-white matter differentiation otherwise maintained. No other areas of remote or chronic infarction. No foci of susceptibility artifact to suggest acute or chronic intracranial hemorrhage. No mass lesion, midline shift or mass effect. Ventricles normal size without hydrocephalus. No extra-axial fluid collection. Pituitary gland suprasellar region normal. Midline structures intact and  normal. Vascular: Major intracranial vascular flow voids are well maintained. Skull and upper cervical spine: Craniocervical junction normal. Upper cervical spine within  normal limits. No focal marrow replacing lesion. No scalp soft tissue abnormality. Sinuses/Orbits: Globes and orbital soft tissues demonstrate no acute finding. Mild axial myopia noted. Paranasal sinuses are largely clear. No mastoid effusion. Inner ear structures normal. Other: None. MRA HEAD FINDINGS ANTERIOR CIRCULATION: Distal cervical segments of the internal carotid arteries are patent with symmetric antegrade flow. Distal cervical right ICA mildly tortuous. Petrous, cavernous, and supraclinoid segments patent without hemodynamically significant stenosis. A1 segments patent bilaterally. Normal anterior communicating artery. Anterior cerebral arteries widely patent to their distal aspects. No M1 stenosis or occlusion. Normal MCA bifurcations. Distal MCA branches well perfused and symmetric. POSTERIOR CIRCULATION: Vertebral arteries largely code dominant and widely patent to the vertebrobasilar junction. Posterior inferior cerebral arteries patent bilaterally. Basilar patent to its distal aspect without stenosis. 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 Superior cerebral arteries patent bilaterally. PCAs supplied via hypoplastic P1 segments as well as prominent bilateral posterior communicating arteries. PCAs well perfused to their distal aspects without stenosis. No intracranial aneurysm or other vascular abnormality. IMPRESSION: MRI HEAD IMPRESSION: 1. 2 cm subacute ischemic nonhemorrhagic infarct involving the ventromedial left thalamus. 2. Otherwise normal brain MRI for age. MRA HEAD IMPRESSION: Normal intracranial MRA. No large vessel occlusion. No hemodynamically significant or correctable stenosis. Electronically Signed   By: Jeannine Boga M.D.   On: 05/10/2019 03:04   Vas [place] Transcranial Doppler W Bubbles  Result Date: 05/11/2019  Transcranial Doppler with Bubble Indications: Stroke. Performing Technologist: Abram Sander RVS  Examination Guidelines: A complete evaluation  includes B-mode imaging, spectral Doppler, color Doppler, and power Doppler as needed of all accessible portions of each vessel. Bilateral testing is considered an integral part of a complete examination. Limited examinations for reoccurring indications may be performed as noted.  Summary:  A vascular evaluation was performed. The right middle cerebral artery was studied. An IV was inserted into the patient's left forearm. Verbal informed consent was obtained.  HITS heard during valsalva PFO size: small Positive TCD Bubble study with valsalva only indicative of a small right to left shunt *See table(s) above for measurements and observations.  Diagnosing physician: Antony Contras MD Electronically signed by Antony Contras MD on 05/11/2019 at 12:47:30 PM.    Final    Vas US Carotid (at [place] Only)  Result Date: 05/10/2019 Carotid Arterial Duplex Study Indications: CVA and Memory Dysfunction. Performing Technologist: Toma Copier RVS  Examination Guidelines: A complete evaluation includes B-mode imaging, spectral Doppler, color Doppler, and power Doppler as needed of all accessible portions of each vessel. Bilateral testing is considered an integral part of a complete examination. Limited examinations for reoccurring indications may be performed as noted.  Right Carotid Findings: +----------+--------+--------+--------+--------+-------------------------+           PSV cm/sEDV cm/sStenosisDescribeComments                  +----------+--------+--------+--------+--------+-------------------------+ CCA Prox  48      11                      mild intimal wall changes +----------+--------+--------+--------+--------+-------------------------+ CCA Distal48      17                                                +----------+--------+--------+--------+--------+-------------------------+  ICA Prox  48      19                                                 +----------+--------+--------+--------+--------+-------------------------+ ICA Mid   67      25                                                +----------+--------+--------+--------+--------+-------------------------+ ICA Distal45      21                      tortuous                  +----------+--------+--------+--------+--------+-------------------------+ ECA       64      15                                                +----------+--------+--------+--------+--------+-------------------------+ +----------+--------+-------+--------+-------------------+           PSV cm/sEDV cmsDescribeArm Pressure (mmHG) +----------+--------+-------+--------+-------------------+ [id number]                                         +----------+--------+-------+--------+-------------------+ +---------+--------+--+--------+--+---------+ VertebralPSV cm/s34EDV cm/s10Antegrade +---------+--------+--+--------+--+---------+  Left Carotid Findings: +----------+--------+--------+--------+--------+-------------------------+           PSV cm/sEDV cm/sStenosisDescribeComments                  +----------+--------+--------+--------+--------+-------------------------+ CCA Prox  82      71                      mild intimal wall changes +----------+--------+--------+--------+--------+-------------------------+ CCA Distal53      17                      mild intimal wall changes +----------+--------+--------+--------+--------+-------------------------+ ICA Prox  51      23                                                +----------+--------+--------+--------+--------+-------------------------+ ICA Mid   85      38                      tortuous                  +----------+--------+--------+--------+--------+-------------------------+ ICA Distal86      35                      tortuous                   +----------+--------+--------+--------+--------+-------------------------+ ECA       43      15                                                +----------+--------+--------+--------+--------+-------------------------+ +----------+--------+--------+--------+-------------------+  SubclavianPSV cm/sEDV cm/sDescribeArm Pressure (mmHG) +----------+--------+--------+--------+-------------------+           85                                          +----------+--------+--------+--------+-------------------+ +---------+--------+--+--------+--+---------+ VertebralPSV cm/s47EDV cm/s18Antegrade +---------+--------+--+--------+--+---------+  Summary: Right Carotid: There is no evidence of stenosis in the right ICA. Left Carotid: There is no evidence of stenosis in the left ICA. Vertebrals:  Bilateral vertebral arteries demonstrate antegrade flow. Subclavians: Normal flow hemodynamics were seen in bilateral subclavian              arteries. *See table(s) above for measurements and observations.     Preliminary    Vas [place] Lower Extremity Venous (dvt)  Result Date: 05/11/2019  Lower Venous Study Indications: Stroke.  Performing Technologist: Abram Sander RVS  Examination Guidelines: A complete evaluation includes B-mode imaging, spectral Doppler, color Doppler, and power Doppler as needed of all accessible portions of each vessel. Bilateral testing is considered an integral part of a complete examination. Limited examinations for reoccurring indications may be performed as noted.  +---------+---------------+---------+-----------+----------+--------------+ RIGHT    CompressibilityPhasicitySpontaneityPropertiesSummary        +---------+---------------+---------+-----------+----------+--------------+ CFV      Full           Yes      Yes                                 +---------+---------------+---------+-----------+----------+--------------+ SFJ      Full                                                         +---------+---------------+---------+-----------+----------+--------------+ FV Prox  Full                                                        +---------+---------------+---------+-----------+----------+--------------+ FV Mid   Full                                                        +---------+---------------+---------+-----------+----------+--------------+ FV DistalFull                                                        +---------+---------------+---------+-----------+----------+--------------+ PFV      Full                                                        +---------+---------------+---------+-----------+----------+--------------+ POP      Full           Yes  Yes                                 +---------+---------------+---------+-----------+----------+--------------+ PTV      Full                                                        +---------+---------------+---------+-----------+----------+--------------+ PERO                                                  Not visualized +---------+---------------+---------+-----------+----------+--------------+   +---------+---------------+---------+-----------+----------+-------+ LEFT     CompressibilityPhasicitySpontaneityPropertiesSummary +---------+---------------+---------+-----------+----------+-------+ CFV      Full           Yes      Yes                          +---------+---------------+---------+-----------+----------+-------+ SFJ      Full                                                 +---------+---------------+---------+-----------+----------+-------+ FV Prox  Full                                                 +---------+---------------+---------+-----------+----------+-------+ FV Mid   Full                                                 +---------+---------------+---------+-----------+----------+-------+ FV DistalFull                                                  +---------+---------------+---------+-----------+----------+-------+ PFV      Full                                                 +---------+---------------+---------+-----------+----------+-------+ POP      Full           Yes      Yes                          +---------+---------------+---------+-----------+----------+-------+ PTV      Full                                                 +---------+---------------+---------+-----------+----------+-------+ PERO     Full                                                 +---------+---------------+---------+-----------+----------+-------+  Summary: Right: There is no evidence of deep vein thrombosis in the lower extremity. No cystic structure found in the popliteal fossa. Left: There is no evidence of deep vein thrombosis in the lower extremity. No cystic structure found in the popliteal fossa.  *See table(s) above for measurements and observations.    Preliminary     PHYSICAL EXAM Pleasant middle-aged African-American lady not in distress. . Afebrile. Head is nontraumatic. Neck is supple without bruit.    Cardiac exam no murmur or gallop. Lungs are clear to auscultation. Distal pulses are well felt. Neurological Exam ;  Awake  Alert oriented x 3. Normal speech and language.diminished attention, registration and recall is poor 0/3.  She is able to name only 2 animals which walk on 4 legs.  She can follow two-step commands.  Eye movements full without nystagmus.fundi were not visualized. Vision acuity and fields appear normal. Hearing is normal. Palatal movements are normal. Face symmetric. Tongue midline. Normal strength, tone, reflexes and coordination. Normal sensation. Gait deferred.  ASSESSMENT/PLAN [name] is a 48 y.o. female with history of hernia surgery 04/26/2019 presenting with memory dysfunction since Thursday 05/05/2019.   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 No source of embolus   TCD bubble positive for small right-to-left shunt with Valsalva only   LE doppler negative for DVT   LDL 117  HgbA1c 5.8  HIV neg  Hypercoagulable labs (ANA, ESR, RPR, lupus anticoagulant, beta-2 glycoprotein, cardiolipin antibody, sickle cell screen ) pending  Lovenox 40 mg sq daily for VTE prophylaxis  No antithrombotic prior to admission, now on aspirin 81 mg daily. Given mild stroke, recommend aspirin 81 mg and plavix 75 mg daily x 3 weeks, then aspirin alone. Orders adjusted.   Therapy recommendations:  No PT, No OT, OP SLP  Disposition:  pending   Possible undiagnosed chronic hypertension  Home meds:  none  Stable . Permissive hypertension (OK if < 220/120) but gradually normalize in 5-7 days . Long-term BP goal normotensive  Hyperlipidemia  Home meds:  No statin  Now on Lipitor 80  LDL 117, goal < 70  Continue statin at discharge  Other Stroke Risk Factors  ETOH use, advised to drink no more than 1 drink(s) a day  Obesity, Body mass index is 33.51 kg/m., recommend weight loss, diet and exercise as appropriate   Other Active Problems  Recent hernia surgery 04/26/2019  Hospital day # 1     She presented with memory difficulties and the stroke occurred following hernia surgery and had a possible paradoxical embolism given finding of PFO and lower extremity venous Dopplers negative for DVT at present.  Continue aspirin and Plavix for stroke prevention for 3 weeks followed by aspirin alone.  Check TEE and loop recorder.  Aggressive risk factor modification.  Long discussion with patient and with Dr. Marylyn Ishihara and answered questions.  Greater than 50% time during this  25-minute visit were spent in counseling and coordination of care about her stroke discussion about stroke work-up, prevention and treatment and answering questions  Antony Contras, MD Medical Director [place] Pager: [phone] 05/11/2019 3:43 PM   To contact Stroke Continuity provider, please refer to [email or web address] After  hours, contact General Neurology

## 2019-05-11 NOTE — Progress Notes (Signed)
  Speech Language Pathology Treatment: Cognitive-Linquistic  Patient Details Name: Carolyn Mclaughlin MRN: [id number] DOB: [birth year] Today's Date: 05/11/2019 Time: 9449-6759 SLP Time Calculation (min) (ACUTE ONLY): 32 min  Assessment / Plan / Recommendation Clinical Impression  Pt was seen for cognitive-linguistic treatment session. She was alert and cooperative throughout the session but verbalized her frustration regarding her still being at the hospital and not being discharged yesterday. 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 She was educated regarding the potential negative impact of her attention, memory, and executive function skills as it relates to her completing those tasks. However, despite education she stated that she would be able to perform all of her tasks without difficulty and would have not difficulty identifying errors. She provided 6-7 items per concrete category during divergent naming tasks with min cues and 3 items per abstract category during divergent naming when mod cues were provided. She recalled 5 unrelated items with 40% accuracy increasing to 80% with mod cues. She completed working memory tasks with min-mod cues. SLP will continue to follow pt.    HPI HPI: Pt is a 48 y.o. female with medical history significant of incarcerated epigastric hernia s/p repair x2 weeks ago. Starting on Thursday 05/05/19 she began to have new memory difficulty. Symptoms have persisted since onset without associated limb weakness, facial droop, vision loss, difficulty walking, aphasia nor dysarthria. 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 Hardin Negus, [place], [place] Office number [phone] Pager [place] 05/11/2019, 5:14 PM

## 2019-05-12 LAB — RENAL FUNCTION PANEL
Albumin: 3.4 g/dL — ABNORMAL LOW (ref 3.5–5.0)
Anion gap: 11 (ref 5–15)
BUN: 18 mg/dL (ref 6–20)
CO2: 23 mmol/L (ref 22–32)
Calcium: 9.5 mg/dL (ref 8.9–10.3)
Chloride: 104 mmol/L (ref 98–111)
Creatinine, Ser: 0.89 mg/dL (ref 0.44–1.00)
GFR calc Af Amer: >60 mL/min (ref >60)
GFR calc non Af Amer: >60 mL/min (ref >60)
Glucose, Bld: 104 mg/dL — ABNORMAL HIGH (ref 70–99)
Phosphorus: 4 mg/dL (ref 2.5–4.6)
Potassium: 3.8 mmol/L (ref 3.5–5.1)
Sodium: 138 mmol/L (ref 135–145)

## 2019-05-12 LAB — MAGNESIUM: Magnesium: 2.1 mg/dL (ref 1.7–2.4)

## 2019-05-12 LAB — CBC WITH DIFFERENTIAL/PLATELET
Abs Immature Granulocytes: 0.02 10*3/uL (ref 0.00–0.07)
Basophils Absolute: 0 10*3/uL (ref 0.0–0.1)
Basophils Relative: 1 %
Eosinophils Absolute: 0.2 10*3/uL (ref 0.0–0.5)
Eosinophils Relative: 4 %
HCT: 37.9 % (ref 36.0–46.0)
Hemoglobin: 12.7 g/dL (ref 12.0–15.0)
Immature Granulocytes: 0 %
Lymphocytes Relative: 29 %
Lymphs Abs: 1.6 10*3/uL (ref 0.7–4.0)
MCH: 30.1 pg (ref 26.0–34.0)
MCHC: 33.5 g/dL (ref 30.0–36.0)
MCV: 89.8 fL (ref 80.0–100.0)
Monocytes Absolute: 0.6 10*3/uL (ref 0.1–1.0)
Monocytes Relative: 11 %
Neutro Abs: 3.1 10*3/uL (ref 1.7–7.7)
Neutrophils Relative %: 55 %
Platelets: 433 10*3/uL — ABNORMAL HIGH (ref 150–400)
RBC: 4.22 MIL/uL (ref 3.87–5.11)
RDW: 11.8 % (ref 11.5–15.5)
WBC: 5.6 10*3/uL (ref 4.0–10.5)
nRBC: 0 % (ref 0.0–0.2)

## 2019-05-12 LAB — LUPUS ANTICOAGULANT PANEL
DRVVT: 44.4 s (ref 0.0–47.0)
PTT Lupus Anticoagulant: 37.9 s (ref 0.0–51.9)

## 2019-05-12 LAB — CARDIOLIPIN ANTIBODIES, IGG, IGM, IGA
Anticardiolipin IgA: <9 APL U/mL (ref 0–11)
Anticardiolipin IgG: <9 GPL U/mL (ref 0–14)
Anticardiolipin IgM: <9 MPL U/mL (ref 0–12)

## 2019-05-12 LAB — FANA STAINING PATTERNS: Speckled Pattern: 1:1280 {titer} — ABNORMAL HIGH

## 2019-05-12 LAB — SICKLE CELL SCREEN: Sickle Cell Screen: NEGATIVE

## 2019-05-12 LAB — ANTINUCLEAR ANTIBODIES, IFA: ANA Ab, IFA: POSITIVE — AB

## 2019-05-12 NOTE — Progress Notes (Signed)
[name]  PROGRESS NOTE    Carolyn Mclaughlin  POE:[id number] DOB: 10/08/1971 DOA: 05/09/2019 PCP: [place], [place]   Brief Narrative:   Carolyn Axelrod [name] a 48 y.o.femalewith medical history significant ofincarcerated epigastric hernia s/p repair x2 weeks ago.  Starting on Thursday (5 days ago) she began to have new memory difficulty. Symptoms have persisted since onset. No associated limb weakness, facial droop, vision loss, difficulty walking, aphasia nor dysarthria.   Assessment & Plan:   Principal Problem:   Acute ischemic stroke ([place]) Active Problems:   Stroke ([place])   Acute ischemic stroke     - L thalamic infarct; thought 2/2 to small vessel disease, but concern for embolic source given recent surgery/post op state     - MRI with 2 cm subacute ischemic nonhemorrhagic infarct involving the ventromedial L thalamus, no LVO, no hemodynamically significant or correctable stenosis     - Echo - EF 55-60% (see report)     - Carotid US with no evidence of stenosis in L or R ICA, bilateral vertebral arteries with antegrade flow (pending final read)     - transcranial doppler with bubbles: Positive TCD Bubble study with valsalva only indicative of a small right to left shunt     - BLE dopplers: There is no evidence of deep vein thrombosis in the lower extremities     - PT/OT/SLP - recommending outpatient SLP      - Neurology c/s, appreciate recommendations     - Pending hypercoagulable panel     - ASA/plavix per neurology     - A1c 5.8, LDL 117     - PFO seen on echo; neuro recs TEE and loop recorded; I have consulted cardiology, will be on the schedule for tomorrow  ? Undiagnosed chronic HTN      - Holding off on starting therapy and allowing permissive HTN in setting of acute stroke.  Mildly elevated liver enzymes, alk phos      - down trending  TEE and loop recorder pending. Will need outpt SLP/cognitive therapy. Have d/w daughter about need for family  support when she initially goes home.   DVT prophylaxis: lovenox Code Status: FULL   Disposition Plan: To home with outpt SLP   Consultants:   Neurology   Subjective: "So I'm not leaving today?"  Objective: Vitals:   05/11/19 1926 05/11/19 2340 05/12/19 0417 05/12/19 0900  BP: (!) 125/96 128/88 122/89 123/87  Pulse: 96 100 88 93  Resp: 18 18 19 17   Temp: 98.4 F (36.9 C) 98.4 F (36.9 C) 98.3 F (36.8 C) 97.7 F (36.5 C)  TempSrc: Oral Oral Oral Oral  SpO2: 100% 98% 100% 98%  Weight:      Height:        Intake/Output Summary (Last 24 hours) at 05/12/2019 1027 Last data filed at 05/11/2019 2300 Gross per 24 hour  Intake 150 ml  Output --  Net 150 ml   Filed Weights   05/10/19 0554  Weight: 83.1 kg    Examination:  General: 48 y.o. female resting in bed in NAD Cardiovascular: RRR, +S1, S2, no m/g/r, equal pulses throughout Respiratory: CTABL, no w/r/r, normal WOB GI: BS+, NDNT, no masses noted, no organomegaly noted MSK: No e/c/c Skin: No rashes, bruises, ulcerations noted Neuro: A&O x 3, no focal deficits    Data Reviewed: I have personally reviewed following labs and imaging studies.  CBC: Recent Labs  Lab 05/09/19 1611 05/09/19 1630 05/11/19 0422 05/12/19 3154  WBC 6.4  --  5.5 5.6  NEUTROABS 3.7  --   --  3.1  HGB 12.1 12.2 12.2 12.7  HCT 37.2 36.0 36.5 37.9  MCV 92.1  --  89.7 89.8  PLT 356  --  405* 712*   Basic Metabolic Panel: Recent Labs  Lab 05/09/19 1611 05/09/19 1630 05/11/19 0422 05/12/19 0352  NA 138 137 138 138  K 3.5 3.6 3.6 3.8  CL 102 102 104 104  CO2 25  --  24 23  GLUCOSE 105* 102* 106* 104*  BUN 8 10 13 18   CREATININE 0.85 0.80 0.85 0.89  CALCIUM 9.4  --  9.6 9.5  MG  --   --  2.2 2.1  PHOS  --   --   --  4.0   GFR: Estimated Creatinine Clearance: 77.2 mL/min (by C-G formula based on SCr of 0.89 mg/dL). Liver Function Tests: Recent Labs  Lab 05/09/19 1611 05/11/19 0422 05/12/19 0352  AST 22 15  --    ALT 51* 32  --   ALKPHOS 158* 136*  --   BILITOT 0.5 0.9  --   PROT 7.4 7.0  --   ALBUMIN 3.7 3.4* 3.4*   No results for input(s): LIPASE, AMYLASE in the last 168 hours. No results for input(s): AMMONIA in the last 168 hours. Coagulation Profile: Recent Labs  Lab 05/09/19 1611  INR 1.0   Cardiac Enzymes: Recent Labs  Lab 05/10/19 0341  TROPONINI <0.03   BNP (last 3 results) No results for input(s): PROBNP in the last 8760 hours. HbA1C: Recent Labs    05/10/19 0341  HGBA1C 5.8*   CBG: Recent Labs  Lab 05/09/19 2349  GLUCAP 104*   Lipid Profile: Recent Labs    05/10/19 0341  CHOL 173  HDL 46  LDLCALC 117*  TRIG 52  CHOLHDL 3.8   Thyroid Function Tests: No results for input(s): TSH, T4TOTAL, FREET4, T3FREE, THYROIDAB in the last 72 hours. Anemia Panel: No results for input(s): VITAMINB12, FOLATE, FERRITIN, TIBC, IRON, RETICCTPCT in the last 72 hours. Sepsis Labs: No results for input(s): PROCALCITON, LATICACIDVEN in the last 168 hours.  Recent Results (from the past 240 hour(s))  SARS Coronavirus 2 (CEPHEID - Performed in [facility] hospital lab), Hosp Order     Status: None   Collection Time: 05/10/19 12:29 AM   Specimen: Nasopharyngeal Swab  Result Value Ref Range Status   SARS Coronavirus 2 NEGATIVE NEGATIVE Final    Comment: (NOTE) If result is NEGATIVE SARS-CoV-2 target nucleic acids are NOT DETECTED. The SARS-CoV-2 RNA is generally detectable in upper and lower  respiratory specimens during the acute phase of infection. The lowest  concentration of SARS-CoV-2 viral copies this assay can detect is 250  copies / mL. A negative result does not preclude SARS-CoV-2 infection  and should not be used as the sole basis for treatment or other  patient management decisions.  A negative result may occur with  improper specimen collection / handling, submission of specimen other  than nasopharyngeal swab, presence of viral mutation(s) within the  areas  targeted by this assay, and inadequate number of viral copies  (<250 copies / mL). A negative result must be combined with clinical  observations, patient history, and epidemiological information. If result is POSITIVE SARS-CoV-2 target nucleic acids are DETECTED. The SARS-CoV-2 RNA is generally detectable in upper and lower  respiratory specimens dur ing the acute phase of infection.  Positive  results are indicative of active infection  with SARS-CoV-2.  Clinical  correlation with patient history and other diagnostic information is  necessary to determine patient infection status.  Positive results do  not rule out bacterial infection or co-infection with other viruses. If result is PRESUMPTIVE POSTIVE SARS-CoV-2 nucleic acids MAY BE PRESENT.   A presumptive positive result was obtained on the submitted specimen  and confirmed on repeat testing.  While 2019 novel coronavirus  (SARS-CoV-2) nucleic acids may be present in the submitted sample  additional confirmatory testing may be necessary for epidemiological  and / or clinical management purposes  to differentiate between  SARS-CoV-2 and other Sarbecovirus currently known to infect humans.  If clinically indicated additional testing with an alternate test  methodology [phone]) is advised. The SARS-CoV-2 RNA is generally  detectable in upper and lower respiratory sp ecimens during the acute  phase of infection. The expected result is Negative. Fact Sheet for Patients:  StrictlyIdeas.no Fact Sheet for Healthcare Providers: BankingDealers.co.za This test is not yet approved or cleared by the [place] FDA and has been authorized for detection and/or diagnosis of SARS-CoV-2 by FDA under an Emergency Use Authorization (EUA).  This EUA will remain in effect (meaning this test can be used) for the duration of the COVID-19 declaration under Section 564(b)(1) of the Act, 21 U.S.C. section  360bbb-3(b)(1), unless the authorization is terminated or revoked sooner. Performed at [facility], [place] [address] [place], [place] [address]          Radiology Studies: Vas [place] Transcranial Doppler W Bubbles  Result Date: 05/11/2019  Transcranial Doppler with Bubble Indications: Stroke. Performing Technologist: Abram Sander RVS  Examination Guidelines: A complete evaluation includes B-mode imaging, spectral Doppler, color Doppler, and power Doppler as needed of all accessible portions of each vessel. Bilateral testing is considered an integral part of a complete examination. Limited examinations for reoccurring indications may be performed as noted.  Summary:  A vascular evaluation was performed. The right middle cerebral artery was studied. An IV was inserted into the patient's left forearm. Verbal informed consent was obtained.  HITS heard during valsalva PFO size: small Positive TCD Bubble study with valsalva only indicative of a small right to left shunt *See table(s) above for measurements and observations.  Diagnosing physician: Antony Contras MD Electronically signed by Antony Contras MD on 05/11/2019 at 12:47:30 PM.    Final    Vas US Carotid (at [place] Only)  Result Date: 05/10/2019 Carotid Arterial Duplex Study Indications: CVA and Memory Dysfunction. Performing Technologist: Toma Copier RVS  Examination Guidelines: A complete evaluation includes B-mode imaging, spectral Doppler, color Doppler, and power Doppler as needed of all accessible portions of each vessel. Bilateral testing is considered an integral part of a complete examination. Limited examinations for reoccurring indications may be performed as noted.  Right Carotid Findings: +----------+--------+--------+--------+--------+-------------------------+             PSV cm/s EDV cm/s Stenosis Describe Comments                   +----------+--------+--------+--------+--------+-------------------------+  CCA Prox   48        11                         mild intimal wall changes  +----------+--------+--------+--------+--------+-------------------------+  CCA Distal 48       17                                                    +----------+--------+--------+--------+--------+-------------------------+  ICA Prox   48       19                                                    +----------+--------+--------+--------+--------+-------------------------+  ICA Mid    67       25                                                    +----------+--------+--------+--------+--------+-------------------------+  ICA Distal 45       21                         tortuous                   +----------+--------+--------+--------+--------+-------------------------+  ECA        64       15                                                    +----------+--------+--------+--------+--------+-------------------------+ +----------+--------+-------+--------+-------------------+             PSV cm/s EDV cms Describe Arm Pressure (mmHG)  +----------+--------+-------+--------+-------------------+  Subclavian 69                                             +----------+--------+-------+--------+-------------------+ +---------+--------+--+--------+--+---------+  Vertebral PSV cm/s 34 EDV cm/s 10 Antegrade  +---------+--------+--+--------+--+---------+  Left Carotid Findings: +----------+--------+--------+--------+--------+-------------------------+             PSV cm/s EDV cm/s Stenosis Describe Comments                   +----------+--------+--------+--------+--------+-------------------------+  CCA Prox   82       71                         mild intimal wall changes  +----------+--------+--------+--------+--------+-------------------------+  CCA Distal 53       17                         mild intimal wall changes  +----------+--------+--------+--------+--------+-------------------------+  ICA Prox   51       23                                                     +----------+--------+--------+--------+--------+-------------------------+  ICA Mid    85       38                         tortuous                   +----------+--------+--------+--------+--------+-------------------------+  ICA Distal 86       35  tortuous                   +----------+--------+--------+--------+--------+-------------------------+  ECA        43       15                                                    +----------+--------+--------+--------+--------+-------------------------+ +----------+--------+--------+--------+-------------------+  Subclavian PSV cm/s EDV cm/s Describe Arm Pressure (mmHG)  +----------+--------+--------+--------+-------------------+             85                                              +----------+--------+--------+--------+-------------------+ +---------+--------+--+--------+--+---------+  Vertebral PSV cm/s 47 EDV cm/s 18 Antegrade  +---------+--------+--+--------+--+---------+  Summary: Right Carotid: There is no evidence of stenosis in the right ICA. Left Carotid: There is no evidence of stenosis in the left ICA. Vertebrals:  Bilateral vertebral arteries demonstrate antegrade flow. Subclavians: Normal flow hemodynamics were seen in bilateral subclavian              arteries. *See table(s) above for measurements and observations.     Preliminary    Vas [place] Lower Extremity Venous (dvt)  Result Date: 05/11/2019  Lower Venous Study Indications: Stroke.  Performing Technologist: Abram Sander RVS  Examination Guidelines: A complete evaluation includes B-mode imaging, spectral Doppler, color Doppler, and power Doppler as needed of all accessible portions of each vessel. Bilateral testing is considered an integral part of a complete examination. Limited examinations for reoccurring indications may be performed as noted.  +---------+---------------+---------+-----------+----------+--------------+  RIGHT      Compressibility Phasicity Spontaneity Properties Summary         +---------+---------------+---------+-----------+----------+--------------+  CFV       Full            Yes       Yes                                    +---------+---------------+---------+-----------+----------+--------------+  SFJ       Full                                                             +---------+---------------+---------+-----------+----------+--------------+  FV Prox   Full                                                             +---------+---------------+---------+-----------+----------+--------------+  FV Mid    Full                                                             +---------+---------------+---------+-----------+----------+--------------+  FV Distal Full                                                             +---------+---------------+---------+-----------+----------+--------------+  PFV       Full                                                             +---------+---------------+---------+-----------+----------+--------------+  POP       Full            Yes       Yes                                    +---------+---------------+---------+-----------+----------+--------------+  PTV       Full                                                             +---------+---------------+---------+-----------+----------+--------------+  PERO                                                       Not visualized  +---------+---------------+---------+-----------+----------+--------------+   +---------+---------------+---------+-----------+----------+-------+  LEFT      Compressibility Phasicity Spontaneity Properties Summary  +---------+---------------+---------+-----------+----------+-------+  CFV       Full            Yes       Yes                             +---------+---------------+---------+-----------+----------+-------+  SFJ       Full                                                       +---------+---------------+---------+-----------+----------+-------+  FV Prox   Full                                                      +---------+---------------+---------+-----------+----------+-------+  FV Mid    Full                                                      +---------+---------------+---------+-----------+----------+-------+  FV Distal Full                                                      +---------+---------------+---------+-----------+----------+-------+  PFV       Full                                                      +---------+---------------+---------+-----------+----------+-------+  POP       Full            Yes       Yes                             +---------+---------------+---------+-----------+----------+-------+  PTV       Full                                                      +---------+---------------+---------+-----------+----------+-------+  PERO      Full                                                      +---------+---------------+---------+-----------+----------+-------+     Summary: Right: There is no evidence of deep vein thrombosis in the lower extremity. No cystic structure found in the popliteal fossa. Left: There is no evidence of deep vein thrombosis in the lower extremity. No cystic structure found in the popliteal fossa.  *See table(s) above for measurements and observations. Electronically signed by Curt Jews MD on 05/11/2019 at 5:02:04 PM.    Final         Scheduled Meds:  aspirin  81 mg Oral Daily   atorvastatin  80 mg Oral q1800   clopidogrel  75 mg Oral Daily   enoxaparin (LOVENOX) injection  40 mg Subcutaneous Q24H   Continuous Infusions:   LOS: 2 days    Time spent: 25 minutes spent in the coordination of care today.    Jonnie Finner, DO [facility] Pager [phone]  If 7PM-7AM, please contact night-coverage [email or web address] Password [facility] 05/12/2019, 10:27 AM

## 2019-05-12 NOTE — Progress Notes (Signed)
    [facility] has been requested to perform a transesophageal echocardiogram on 05/13/19 for CVA.  After careful review of history and examination, the risks and benefits of transesophageal echocardiogram have been explained including risks of esophageal damage, perforation (1:10,000 risk), bleeding, pharyngeal hematoma as well as other potential complications associated with conscious sedation including aspiration, arrhythmia, respiratory failure and death. Alternatives to treatment were discussed, questions were answered. Patient is willing to proceed.   Reino Bellis, NP-C 05/12/2019 3:18 PM

## 2019-05-12 NOTE — Progress Notes (Signed)
STROKE TEAM PROGRESS NOTE   INTERVAL HISTORY She continues to have short-term memory difficulties.  No other changes.  She is scheduled for TEE and loop recorder.  Urine drug screen negative.  Sickle cell screen and anticardiolipin antibodies and lupus anticoagulant are pending.  ESR is elevated at 52 mm.  Is pending Vitals:   05/11/19 1926 05/11/19 2340 05/12/19 0417 05/12/19 0900  BP: (!) 125/96 128/88 122/89 123/87  Pulse: 96 100 88 93  Resp: _0 Temp: 98.4 F (36.9 C) 98.4 F (36.9 C) 98.3 F (36.8 C) 97.7 F (36.5 C)  TempSrc: Oral Oral Oral Oral  SpO2: 100% 98% 100% 98%  Weight:      Height:        CBC:  Recent Labs  Lab 05/09/19 1611  05/11/19 0422 05/12/19 0352  WBC 6.4  --  5.5 5.6  NEUTROABS 3.7  --   --  3.1  HGB 12.1   < > 12.2 12.7  HCT 37.2   < > 36.5 37.9  MCV 92.1  --  89.7 89.8  PLT 356  --  405* 433*   < > = values in this interval not displayed.    Basic Metabolic Panel:  Recent Labs  Lab 05/11/19 0422 05/12/19 0352  NA 138 138  K 3.6 3.8  CL 104 104  CO2 24 23  GLUCOSE 106* 104*  BUN 13 18  CREATININE 0.85 0.89  CALCIUM 9.6 9.5  MG 2.2 2.1  PHOS  --  4.0   Lipid Panel:     Component Value Date/Time   CHOL 173 05/10/2019 0341   TRIG 52 05/10/2019 0341   HDL 46 05/10/2019 0341   CHOLHDL 3.8 05/10/2019 0341   VLDL 10 05/10/2019 0341   LDLCALC 117 (H) 05/10/2019 0341   HgbA1c:  Lab Results  Component Value Date   HGBA1C 5.8 (H) 05/10/2019   Urine Drug Screen:     Component Value Date/Time   LABOPIA NONE DETECTED 05/10/2019 0837   COCAINSCRNUR NONE DETECTED 05/10/2019 0837   LABBENZ NONE DETECTED 05/10/2019 0837   AMPHETMU NONE DETECTED 05/10/2019 0837   THCU NONE DETECTED 05/10/2019 0837   LABBARB NONE DETECTED 05/10/2019 0837    Alcohol Level No results found for: [facility]  IMAGING Vas [place] Transcranial Doppler W Bubbles  Result Date: 05/11/2019  Transcranial Doppler with Bubble Indications: Stroke. Performing  Technologist: Abram Sander RVS  Examination Guidelines: A complete evaluation includes B-mode imaging, spectral Doppler, color Doppler, and power Doppler as needed of all accessible portions of each vessel. Bilateral testing is considered an integral part of a complete examination. Limited examinations for reoccurring indications may be performed as noted.  Summary:  A vascular evaluation was performed. The right middle cerebral artery was studied. An IV was inserted into the patient's left forearm. Verbal informed consent was obtained.  HITS heard during valsalva PFO size: small Positive TCD Bubble study with valsalva only indicative of a small right to left shunt *See table(s) above for measurements and observations.  Diagnosing physician: Antony Contras MD Electronically signed by Antony Contras MD on 05/11/2019 at 12:47:30 PM.    Final    Vas US Carotid (at [place] Only)  Result Date: 05/12/2019 Carotid Arterial Duplex Study Indications: CVA and Memory Dysfunction. Performing Technologist: Toma Copier RVS  Examination Guidelines: A complete evaluation includes B-mode imaging, spectral Doppler, color Doppler, and power Doppler as needed of all accessible portions of each vessel. Bilateral testing is considered  an integral part of a complete examination. Limited examinations for reoccurring indications may be performed as noted.  Right Carotid Findings: +----------+--------+--------+--------+--------+-------------------------+           PSV cm/sEDV cm/sStenosisDescribeComments                  +----------+--------+--------+--------+--------+-------------------------+ CCA Prox  48      11                      mild intimal wall changes +----------+--------+--------+--------+--------+-------------------------+ CCA Distal48      17                                                +----------+--------+--------+--------+--------+-------------------------+ ICA Prox  48      19                                                 +----------+--------+--------+--------+--------+-------------------------+ ICA Mid   67      25                                                +----------+--------+--------+--------+--------+-------------------------+ ICA Distal45      21                      tortuous                  +----------+--------+--------+--------+--------+-------------------------+ ECA       64      15                                                +----------+--------+--------+--------+--------+-------------------------+ +----------+--------+-------+--------+-------------------+           PSV cm/sEDV cmsDescribeArm Pressure (mmHG) +----------+--------+-------+--------+-------------------+ [id number]                                         +----------+--------+-------+--------+-------------------+ +---------+--------+--+--------+--+---------+ VertebralPSV cm/s34EDV cm/s10Antegrade +---------+--------+--+--------+--+---------+  Left Carotid Findings: +----------+--------+--------+--------+--------+-------------------------+           PSV cm/sEDV cm/sStenosisDescribeComments                  +----------+--------+--------+--------+--------+-------------------------+ CCA Prox  82      71                      mild intimal wall changes +----------+--------+--------+--------+--------+-------------------------+ CCA Distal53      17                      mild intimal wall changes +----------+--------+--------+--------+--------+-------------------------+ ICA Prox  51      23                                                +----------+--------+--------+--------+--------+-------------------------+  ICA Mid   85      38                      tortuous                  +----------+--------+--------+--------+--------+-------------------------+ ICA Distal86      35                      tortuous                   +----------+--------+--------+--------+--------+-------------------------+ ECA       43      15                                                +----------+--------+--------+--------+--------+-------------------------+ +----------+--------+--------+--------+-------------------+ SubclavianPSV cm/sEDV cm/sDescribeArm Pressure (mmHG) +----------+--------+--------+--------+-------------------+           85                                          +----------+--------+--------+--------+-------------------+ +---------+--------+--+--------+--+---------+ VertebralPSV cm/s47EDV cm/s18Antegrade +---------+--------+--+--------+--+---------+  Summary: Right Carotid: There is no evidence of stenosis in the right ICA. Left Carotid: There is no evidence of stenosis in the left ICA. Vertebrals:  Bilateral vertebral arteries demonstrate antegrade flow. Subclavians: Normal flow hemodynamics were seen in bilateral subclavian              arteries. *See table(s) above for measurements and observations.  Electronically signed by Antony Contras MD on 05/12/2019 at 12:36:39 PM.    Final    Vas [place] Lower Extremity Venous (dvt)  Result Date: 05/11/2019  Lower Venous Study Indications: Stroke.  Performing Technologist: Abram Sander RVS  Examination Guidelines: A complete evaluation includes B-mode imaging, spectral Doppler, color Doppler, and power Doppler as needed of all accessible portions of each vessel. Bilateral testing is considered an integral part of a complete examination. Limited examinations for reoccurring indications may be performed as noted.  +---------+---------------+---------+-----------+----------+--------------+ RIGHT    CompressibilityPhasicitySpontaneityPropertiesSummary        +---------+---------------+---------+-----------+----------+--------------+ CFV      Full           Yes      Yes                                  +---------+---------------+---------+-----------+----------+--------------+ SFJ      Full                                                        +---------+---------------+---------+-----------+----------+--------------+ FV Prox  Full                                                        +---------+---------------+---------+-----------+----------+--------------+ FV Mid   Full                                                        +---------+---------------+---------+-----------+----------+--------------+  FV DistalFull                                                        +---------+---------------+---------+-----------+----------+--------------+ PFV      Full                                                        +---------+---------------+---------+-----------+----------+--------------+ POP      Full           Yes      Yes                                 +---------+---------------+---------+-----------+----------+--------------+ PTV      Full                                                        +---------+---------------+---------+-----------+----------+--------------+ PERO                                                  Not visualized +---------+---------------+---------+-----------+----------+--------------+   +---------+---------------+---------+-----------+----------+-------+ LEFT     CompressibilityPhasicitySpontaneityPropertiesSummary +---------+---------------+---------+-----------+----------+-------+ CFV      Full           Yes      Yes                          +---------+---------------+---------+-----------+----------+-------+ SFJ      Full                                                 +---------+---------------+---------+-----------+----------+-------+ FV Prox  Full                                                 +---------+---------------+---------+-----------+----------+-------+ FV Mid   Full                                                  +---------+---------------+---------+-----------+----------+-------+ FV DistalFull                                                 +---------+---------------+---------+-----------+----------+-------+ PFV      Full                                                 +---------+---------------+---------+-----------+----------+-------+  POP      Full           Yes      Yes                          +---------+---------------+---------+-----------+----------+-------+ PTV      Full                                                 +---------+---------------+---------+-----------+----------+-------+ PERO     Full                                                 +---------+---------------+---------+-----------+----------+-------+     Summary: Right: There is no evidence of deep vein thrombosis in the lower extremity. No cystic structure found in the popliteal fossa. Left: There is no evidence of deep vein thrombosis in the lower extremity. No cystic structure found in the popliteal fossa.  *See table(s) above for measurements and observations. Electronically signed by Curt Jews MD on 05/11/2019 at 5:02:04 PM.    Final     PHYSICAL EXAM Pleasant middle-aged African-American lady not in distress. . Afebrile. Head is nontraumatic. Neck is supple without bruit.    Cardiac exam no murmur or gallop. Lungs are clear to auscultation. Distal pulses are well felt. Neurological Exam ;  Awake  Alert oriented x 3. Normal speech and language.diminished attention, registration and recall is poor 0/3.  She is able to name only 2 animals which walk on 4 legs.  She can follow two-step commands.  Eye movements full without nystagmus.fundi were not visualized. Vision acuity and fields appear normal. Hearing is normal. Palatal movements are normal. Face symmetric. Tongue midline. Normal strength, tone, reflexes and coordination. Normal sensation. Gait  deferred.  ASSESSMENT/PLAN Carolyn Mclaughlin is a 48 y.o. female with history of hernia surgery 04/26/2019 presenting with memory dysfunction since Thursday 05/05/2019.   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 No source of embolus   TCD bubble positive for small right-to-left shunt with Valsalva only   LE doppler negative for DVT   LDL 117  HgbA1c 5.8  HIV neg  Hypercoagulable labs (ANA, ESR, RPR, lupus anticoagulant, beta-2 glycoprotein, cardiolipin antibody, sickle cell screen ) pending  Lovenox 40 mg sq daily for VTE prophylaxis  No antithrombotic prior to admission, now on aspirin 81 mg daily. Given mild stroke, recommend aspirin 81 mg and plavix 75 mg daily x 3 weeks, then aspirin alone. Orders adjusted.   Therapy recommendations:  No PT, No OT, OP SLP  Disposition:  pending   Possible undiagnosed chronic hypertension  Home meds:  none  Stable . Permissive hypertension (OK if < 220/120) but gradually normalize in 5-7 days . Long-term BP goal normotensive  Hyperlipidemia  Home meds:  No statin  Now on Lipitor 80  LDL 117, goal < 70  Continue statin at discharge  Other Stroke Risk Factors  ETOH use, advised to drink no more than 1 drink(s) a day  Obesity, Body mass index is 33.51 kg/m., recommend weight loss, diet and exercise as appropriate   Other Active Problems  Recent hernia surgery 04/26/2019  Hospital day # 2   .  Continue aspirin and Plavix for stroke prevention for 3 weeks followed by aspirin alone.  Check TEE and loop recorder.  Aggressive risk factor modification.  Long discussion with patient and with Dr. Marylyn Ishihara and answered questions.    Antony Contras, MD Medical  Director [facility] Pager: [phone] 05/12/2019 1:09 PM   To contact Stroke Continuity provider, please refer to [email or web address] After hours, contact General Neurology

## 2019-05-12 NOTE — H&P (View-Only) (Signed)
STROKE TEAM PROGRESS NOTE   INTERVAL HISTORY She continues to have short-term memory difficulties.  No other changes.  She is scheduled for TEE and loop recorder.  Urine drug screen negative.  Sickle cell screen and anticardiolipin antibodies and lupus anticoagulant are pending.  ESR is elevated at 52 mm.  Is pending Vitals:   05/11/19 1926 05/11/19 2340 05/12/19 0417 05/12/19 0900  BP: (!) 125/96 128/88 122/89 123/87  Pulse: 96 100 88 93  Resp: _0 Temp: 98.4 F (36.9 C) 98.4 F (36.9 C) 98.3 F (36.8 C) 97.7 F (36.5 C)  TempSrc: Oral Oral Oral Oral  SpO2: 100% 98% 100% 98%  Weight:      Height:        CBC:  Recent Labs  Lab 05/09/19 1611  05/11/19 0422 05/12/19 0352  WBC 6.4  --  5.5 5.6  NEUTROABS 3.7  --   --  3.1  HGB 12.1   < > 12.2 12.7  HCT 37.2   < > 36.5 37.9  MCV 92.1  --  89.7 89.8  PLT 356  --  405* 433*   < > = values in this interval not displayed.    Basic Metabolic Panel:  Recent Labs  Lab 05/11/19 0422 05/12/19 0352  NA 138 138  K 3.6 3.8  CL 104 104  CO2 24 23  GLUCOSE 106* 104*  BUN 13 18  CREATININE 0.85 0.89  CALCIUM 9.6 9.5  MG 2.2 2.1  PHOS  --  4.0   Lipid Panel:     Component Value Date/Time   CHOL 173 05/10/2019 0341   TRIG 52 05/10/2019 0341   HDL 46 05/10/2019 0341   CHOLHDL 3.8 05/10/2019 0341   VLDL 10 05/10/2019 0341   LDLCALC 117 (H) 05/10/2019 0341   HgbA1c:  Lab Results  Component Value Date   HGBA1C 5.8 (H) 05/10/2019   Urine Drug Screen:     Component Value Date/Time   LABOPIA NONE DETECTED 05/10/2019 0837   COCAINSCRNUR NONE DETECTED 05/10/2019 0837   LABBENZ NONE DETECTED 05/10/2019 0837   AMPHETMU NONE DETECTED 05/10/2019 0837   THCU NONE DETECTED 05/10/2019 0837   LABBARB NONE DETECTED 05/10/2019 0837    Alcohol Level No results found for: [facility]  IMAGING Vas [place] Transcranial Doppler W Bubbles  Result Date: 05/11/2019  Transcranial Doppler with Bubble Indications: Stroke. Performing  Technologist: Abram Sander RVS  Examination Guidelines: A complete evaluation includes B-mode imaging, spectral Doppler, color Doppler, and power Doppler as needed of all accessible portions of each vessel. Bilateral testing is considered an integral part of a complete examination. Limited examinations for reoccurring indications may be performed as noted.  Summary:  A vascular evaluation was performed. The right middle cerebral artery was studied. An IV was inserted into the patient's left forearm. Verbal informed consent was obtained.  HITS heard during valsalva PFO size: small Positive TCD Bubble study with valsalva only indicative of a small right to left shunt *See table(s) above for measurements and observations.  Diagnosing physician: Antony Contras MD Electronically signed by Antony Contras MD on 05/11/2019 at 12:47:30 PM.    Final    Vas US Carotid (at [place] Only)  Result Date: 05/12/2019 Carotid Arterial Duplex Study Indications: CVA and Memory Dysfunction. Performing Technologist: Toma Copier RVS  Examination Guidelines: A complete evaluation includes B-mode imaging, spectral Doppler, color Doppler, and power Doppler as needed of all accessible portions of each vessel. Bilateral testing is considered  an integral part of a complete examination. Limited examinations for reoccurring indications may be performed as noted.  Right Carotid Findings: +----------+--------+--------+--------+--------+-------------------------+           PSV cm/sEDV cm/sStenosisDescribeComments                  +----------+--------+--------+--------+--------+-------------------------+ CCA Prox  48      11                      mild intimal wall changes +----------+--------+--------+--------+--------+-------------------------+ CCA Distal48      17                                                +----------+--------+--------+--------+--------+-------------------------+ ICA Prox  48      19                                                 +----------+--------+--------+--------+--------+-------------------------+ ICA Mid   67      25                                                +----------+--------+--------+--------+--------+-------------------------+ ICA Distal45      21                      tortuous                  +----------+--------+--------+--------+--------+-------------------------+ ECA       64      15                                                +----------+--------+--------+--------+--------+-------------------------+ +----------+--------+-------+--------+-------------------+           PSV cm/sEDV cmsDescribeArm Pressure (mmHG) +----------+--------+-------+--------+-------------------+ [id number]                                         +----------+--------+-------+--------+-------------------+ +---------+--------+--+--------+--+---------+ VertebralPSV cm/s34EDV cm/s10Antegrade +---------+--------+--+--------+--+---------+  Left Carotid Findings: +----------+--------+--------+--------+--------+-------------------------+           PSV cm/sEDV cm/sStenosisDescribeComments                  +----------+--------+--------+--------+--------+-------------------------+ CCA Prox  82      71                      mild intimal wall changes +----------+--------+--------+--------+--------+-------------------------+ CCA Distal53      17                      mild intimal wall changes +----------+--------+--------+--------+--------+-------------------------+ ICA Prox  51      23                                                +----------+--------+--------+--------+--------+-------------------------+  ICA Mid   85      38                      tortuous                  +----------+--------+--------+--------+--------+-------------------------+ ICA Distal86      35                      tortuous                   +----------+--------+--------+--------+--------+-------------------------+ ECA       43      15                                                +----------+--------+--------+--------+--------+-------------------------+ +----------+--------+--------+--------+-------------------+ SubclavianPSV cm/sEDV cm/sDescribeArm Pressure (mmHG) +----------+--------+--------+--------+-------------------+           85                                          +----------+--------+--------+--------+-------------------+ +---------+--------+--+--------+--+---------+ VertebralPSV cm/s47EDV cm/s18Antegrade +---------+--------+--+--------+--+---------+  Summary: Right Carotid: There is no evidence of stenosis in the right ICA. Left Carotid: There is no evidence of stenosis in the left ICA. Vertebrals:  Bilateral vertebral arteries demonstrate antegrade flow. Subclavians: Normal flow hemodynamics were seen in bilateral subclavian              arteries. *See table(s) above for measurements and observations.  Electronically signed by Antony Contras MD on 05/12/2019 at 12:36:39 PM.    Final    Vas [place] Lower Extremity Venous (dvt)  Result Date: 05/11/2019  Lower Venous Study Indications: Stroke.  Performing Technologist: Abram Sander RVS  Examination Guidelines: A complete evaluation includes B-mode imaging, spectral Doppler, color Doppler, and power Doppler as needed of all accessible portions of each vessel. Bilateral testing is considered an integral part of a complete examination. Limited examinations for reoccurring indications may be performed as noted.  +---------+---------------+---------+-----------+----------+--------------+ RIGHT    CompressibilityPhasicitySpontaneityPropertiesSummary        +---------+---------------+---------+-----------+----------+--------------+ CFV      Full           Yes      Yes                                  +---------+---------------+---------+-----------+----------+--------------+ SFJ      Full                                                        +---------+---------------+---------+-----------+----------+--------------+ FV Prox  Full                                                        +---------+---------------+---------+-----------+----------+--------------+ FV Mid   Full                                                        +---------+---------------+---------+-----------+----------+--------------+  FV DistalFull                                                        +---------+---------------+---------+-----------+----------+--------------+ PFV      Full                                                        +---------+---------------+---------+-----------+----------+--------------+ POP      Full           Yes      Yes                                 +---------+---------------+---------+-----------+----------+--------------+ PTV      Full                                                        +---------+---------------+---------+-----------+----------+--------------+ PERO                                                  Not visualized +---------+---------------+---------+-----------+----------+--------------+   +---------+---------------+---------+-----------+----------+-------+ LEFT     CompressibilityPhasicitySpontaneityPropertiesSummary +---------+---------------+---------+-----------+----------+-------+ CFV      Full           Yes      Yes                          +---------+---------------+---------+-----------+----------+-------+ SFJ      Full                                                 +---------+---------------+---------+-----------+----------+-------+ FV Prox  Full                                                 +---------+---------------+---------+-----------+----------+-------+ FV Mid   Full                                                  +---------+---------------+---------+-----------+----------+-------+ FV DistalFull                                                 +---------+---------------+---------+-----------+----------+-------+ PFV      Full                                                 +---------+---------------+---------+-----------+----------+-------+  POP      Full           Yes      Yes                          +---------+---------------+---------+-----------+----------+-------+ PTV      Full                                                 +---------+---------------+---------+-----------+----------+-------+ PERO     Full                                                 +---------+---------------+---------+-----------+----------+-------+     Summary: Right: There is no evidence of deep vein thrombosis in the lower extremity. No cystic structure found in the popliteal fossa. Left: There is no evidence of deep vein thrombosis in the lower extremity. No cystic structure found in the popliteal fossa.  *See table(s) above for measurements and observations. Electronically signed by Curt Jews MD on 05/11/2019 at 5:02:04 PM.    Final     PHYSICAL EXAM Pleasant middle-aged African-American lady not in distress. . Afebrile. Head is nontraumatic. Neck is supple without bruit.    Cardiac exam no murmur or gallop. Lungs are clear to auscultation. Distal pulses are well felt. Neurological Exam ;  Awake  Alert oriented x 3. Normal speech and language.diminished attention, registration and recall is poor 0/3.  She is able to name only 2 animals which walk on 4 legs.  She can follow two-step commands.  Eye movements full without nystagmus.fundi were not visualized. Vision acuity and fields appear normal. Hearing is normal. Palatal movements are normal. Face symmetric. Tongue midline. Normal strength, tone, reflexes and coordination. Normal sensation. Gait  deferred.  ASSESSMENT/PLAN Carolyn Mclaughlin is a 48 y.o. female with history of hernia surgery 04/26/2019 presenting with memory dysfunction since Thursday 05/05/2019.   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 No source of embolus   TCD bubble positive for small right-to-left shunt with Valsalva only   LE doppler negative for DVT   LDL 117  HgbA1c 5.8  HIV neg  Hypercoagulable labs (ANA, ESR, RPR, lupus anticoagulant, beta-2 glycoprotein, cardiolipin antibody, sickle cell screen ) pending  Lovenox 40 mg sq daily for VTE prophylaxis  No antithrombotic prior to admission, now on aspirin 81 mg daily. Given mild stroke, recommend aspirin 81 mg and plavix 75 mg daily x 3 weeks, then aspirin alone. Orders adjusted.   Therapy recommendations:  No PT, No OT, OP SLP  Disposition:  pending   Possible undiagnosed chronic hypertension  Home meds:  none  Stable . Permissive hypertension (OK if < 220/120) but gradually normalize in 5-7 days . Long-term BP goal normotensive  Hyperlipidemia  Home meds:  No statin  Now on Lipitor 80  LDL 117, goal < 70  Continue statin at discharge  Other Stroke Risk Factors  ETOH use, advised to drink no more than 1 drink(s) a day  Obesity, Body mass index is 33.51 kg/m., recommend weight loss, diet and exercise as appropriate   Other Active Problems  Recent hernia surgery 04/26/2019  Hospital day # 2   .  Continue aspirin and Plavix for stroke prevention for 3 weeks followed by aspirin alone.  Check TEE and loop recorder.  Aggressive risk factor modification.  Long discussion with patient and with Dr. Marylyn Ishihara and answered questions.    Antony Contras, MD Medical  Director [facility] Pager: [phone] 05/12/2019 1:09 PM   To contact Stroke Continuity provider, please refer to [email or web address] After hours, contact General Neurology

## 2019-05-12 NOTE — Consult Note (Addendum)
ELECTROPHYSIOLOGY CONSULT NOTE  Patient ID: Carolyn Mclaughlin MRN: [id number], DOB/AGE: October 31, 1971   Admit date: 05/09/2019 Date of Consult: 05/13/2019  Primary Physician: [place] Primary Cardiologist: none Reason for Consultation: Cryptogenic stroke ; recommendations regarding Implantable Loop Recorder, requested by Dr. Leonie Man  History of Present Illness Carolyn Mclaughlin was admitted on 05/09/2019 with stroke.  They first developed symptoms a few days prior to admission of difficulty with memory PMHx is negative outside of a recent incarcerated epigastric hernia s/p repair x2 weeks ago  Neurology notes;   L thalamic infarct most likely due to small vessel disease, however cannot rule out embolic source given recent surgery/post op state .  she has undergone workup for stroke including echocardiogram and carotid dopplers.  The patient has been monitored on telemetry which has demonstrated sinus rhythm with no arrhythmias.  Inpatient stroke work-up is to be completed with a TEE.   Echocardiogram this admission demonstrated   IMPRESSIONS 1. The left ventricle has normal systolic function, with an ejection fraction of 55-60%. The cavity size was normal. Left ventricular diastolic parameters were normal. No evidence of left ventricular regional wall motion abnormalities.  2. The right ventricle has normal systolic function. The cavity was normal. There is no increase in right ventricular wall thickness.  FINDINGS  Left Ventricle: The left ventricle has normal systolic function, with an ejection fraction of 55-60%. The cavity size was normal. There is no increase in left ventricular wall thickness. Left ventricular diastolic parameters were normal. Normal left  ventricular filling pressures No evidence of left ventricular regional wall motion abnormalities..  Right Ventricle: The right ventricle has normal systolic function. The cavity was normal. There is no increase  in right ventricular wall thickness.  Left Atrium: Left atrial size was normal in size.  Right Atrium: Right atrial size was normal in size. Right atrial pressure is estimated at 10 mmHg.  Interatrial Septum: No atrial level shunt detected by color flow Doppler.  Pericardium: There is no evidence of pericardial effusion.  Mitral Valve: The mitral valve is normal in structure. Mitral valve regurgitation is not visualized by color flow Doppler.  Tricuspid Valve: The tricuspid valve is normal in structure. Tricuspid valve regurgitation is trivial by color flow Doppler.  Aortic Valve: The aortic valve is normal in structure. Aortic valve regurgitation was not visualized by color flow Doppler.  Pulmonic Valve: The pulmonic valve was normal in structure. Pulmonic valve regurgitation is not visualized by color flow Doppler.  Venous: The inferior vena cava measures 1.16 cm, is normal in size with greater than 50% respiratory variability.    Lab work is reviewed.   Prior to admission, the patient denies chest pain, shortness of breath, dizziness, palpitations, or syncope.  They are recovering from their stroke with plans to home at discharge.    Past Medical History:  Diagnosis Date   Headache    Incarcerated epigastric hernia 05/22/6332   Umbilical hernia 5/45/6256     Surgical History:  Past Surgical History:  Procedure Laterality Date   NO PAST SURGERIES     VENTRAL HERNIA REPAIR N/A 04/26/2019   Procedure: LAPRASCOPIC REPAIR OF INCARCERATED EPIGASTRIC HERNIA WITH MESH;  Surgeon: Fanny Skates, MD;  Location: [facility];  Service: General;  Laterality: N/A;     Medications Prior to Admission  Medication Sig Dispense Refill Last Dose   ibuprofen (ADVIL) 200 MG tablet Take 200-400 mg by mouth every 8 (eight) hours as needed (pain.).   Past  Month at Unknown time   nabumetone (RELAFEN) 750 MG tablet Take 750 mg by mouth 2 (two) times daily as needed for pain.   Past  Month at Unknown time    Inpatient Medications:   aspirin  81 mg Oral Daily   atorvastatin  80 mg Oral q1800   clopidogrel  75 mg Oral Daily   enoxaparin (LOVENOX) injection  40 mg Subcutaneous Q24H    Allergies: No Known Allergies  Social History   Socioeconomic History   Marital status: Single    Spouse name: Not on file   Number of children: Not on file   Years of education: Not on file   Highest education level: Not on file  Occupational History   Not on file  Social Needs   Financial resource strain: Not on file   Food insecurity    Worry: Not on file    Inability: Not on file   Transportation needs    Medical: Not on file    Non-medical: Not on file  Tobacco Use   Smoking status: Never Smoker   Smokeless tobacco: Never Used  Substance and Sexual Activity   Alcohol use: Yes    Comment: socially-wine   Drug use: Never   Sexual activity: Not on file  Lifestyle   Physical activity    Days per week: Not on file    Minutes per session: Not on file   Stress: Not on file  Relationships   Social connections    Talks on phone: Not on file    Gets together: Not on file    Attends religious service: Not on file    Active member of club or organization: Not on file    Attends meetings of clubs or organizations: Not on file    Relationship status: Not on file   Intimate partner violence    Fear of current or ex partner: Not on file    Emotionally abused: Not on file    Physically abused: Not on file    Forced sexual activity: Not on file  Other Topics Concern   Not on file  Social History Narrative   Not on file     Family History  Family history unknown: Yes   The patient has a hard time with recalling her family history, she reports both parents dying quite young, thinks both had strokes   Review of Systems: All other systems reviewed and are otherwise negative except as noted above.  Physical Exam: Vitals:   05/12/19 1700  05/12/19 1955 05/13/19 0020 05/13/19 0329  BP: (!) 131/92 (!) 132/101 120/88 119/79  Pulse: 93 95 (!) 102 93  Resp: Temp: 97.9 F (36.6 C) 98.7 F (37.1 C) 98.7 F (37.1 C) 98.8 F (37.1 C)  TempSrc: Oral Oral Oral Oral  SpO2: 98% 97% 100% 99%  Weight:      Height:        Limited visual exam given COVID19 pandemic GEN- The patient is well appearing, alert and oriented x 3 today.   Head- normocephalic, atraumatic Eyes-  Sclera clear, conjunctiva pink Ears- hearing intact Neck- supple Lungs- normal work of breathing Heart- telemetry and echo are reviewed  Abdomen: not examined Extremities- no clubbing, cyanosis, or edema MS- no significant deformity or atrophy Skin- no rash or lesion Psych- euthymic mood, full affect   Labs:   Lab Results  Component Value Date   WBC 5.6 05/12/2019   HGB 12.7 05/12/2019  HCT 37.9 05/12/2019   MCV 89.8 05/12/2019   PLT 433 (H) 05/12/2019    Recent Labs  Lab 05/11/19 0422 05/12/19 0352  NA 138 138  K 3.6 3.8  CL 104 104  CO2 24 23  BUN 13 18  CREATININE 0.85 0.89  CALCIUM 9.6 9.5  PROT 7.0  --   BILITOT 0.9  --   ALKPHOS 136*  --   ALT 32  --   AST 15  --   GLUCOSE 106* 104*   Lab Results  Component Value Date   TROPONINI <0.03 05/10/2019   Lab Results  Component Value Date   CHOL 173 05/10/2019   Lab Results  Component Value Date   HDL 46 05/10/2019   Lab Results  Component Value Date   LDLCALC 117 (H) 05/10/2019   Lab Results  Component Value Date   TRIG 52 05/10/2019   Lab Results  Component Value Date   CHOLHDL 3.8 05/10/2019   No results found for: LDLDIRECT  No results found for: DDIMER   Radiology/Studies:   Dg Chest 2 View Result Date: 05/10/2019 CLINICAL DATA:  Initial evaluation for acute stroke. EXAM: CHEST - 2 VIEW COMPARISON:  Prior radiograph 02/18/2012. FINDINGS: The cardiac and mediastinal silhouettes are stable in size and contour, and remain within normal limits. The  lungs are normally inflated. No airspace consolidation, pleural effusion, or pulmonary edema is identified. There is no pneumothorax. No acute osseous abnormality identified. IMPRESSION: No active cardiopulmonary disease. Electronically Signed   By: Rise MuBenjamin  McClintock M.D.   On: 05/10/2019 01:39     Ct Head Wo Contrast Result Date: 05/09/2019 CLINICAL DATA:  Pt in with daughter reporting memory loss in patient for the last few days EXAM: CT HEAD WITHOUT CONTRAST TECHNIQUE: Contiguous axial images were obtained from the base of the skull through the vertex without intravenous contrast. COMPARISON:  None. FINDINGS: Brain: Hypoattenuation in the left basal ganglia and thalamus, possibly subacute infarct. No acute hemorrhage. No hydrocephalus, extra-axial collection, mass lesion, or mass effect. Vascular: No hyperdense vessel or unexpected calcification. Skull: Normal. Negative for fracture or focal lesion. Sinuses/Orbits: No acute finding. Other: None IMPRESSION: Hypoattenuation in the left basal ganglia and thalamus, possibly subacute infarct. No acute hemorrhage. Electronically Signed   By: Corlis Leak  Hassell M.D.   On: 05/09/2019 18:57     Mr Shirlee LatchMra Head WJWo Contrast Result Date: 05/10/2019 CLINICAL DATA:  Initial evaluation for acute stroke, subacute stroke on prior CT. EXAM: MRI HEAD WITHOUT CONTRAST MRA HEAD WITHOUT CONTRAST TECHNIQUE: Multiplanar, multiecho pulse sequences of the brain and surrounding structures were obtained without intravenous contrast. Angiographic images of the head were obtained using MRA technique without contrast. COMPARISON:  Prior CT from 05/09/2019 FINDINGS: MRI HEAD FINDINGS Brain: Cerebral volume within normal limits for age. No significant cerebral white matter changes. 2 cm subacute ischemic nonhemorrhagic infarcts seen involving the ventral medial left thalamus, corresponding with abnormality on prior CT. Associated T2/FLAIR signal abnormality without significant regional mass  effect or edema. No other evidence for acute or subacute ischemia. Gray-white matter differentiation otherwise maintained. No other areas of remote or chronic infarction. No foci of susceptibility artifact to suggest acute or chronic intracranial hemorrhage. No mass lesion, midline shift or mass effect. Ventricles normal size without hydrocephalus. No extra-axial fluid collection. Pituitary gland suprasellar region normal. Midline structures intact and normal. Vascular: Major intracranial vascular flow voids are well maintained. Skull and upper cervical spine: Craniocervical junction normal. Upper cervical spine within normal limits. No  focal marrow replacing lesion. No scalp soft tissue abnormality. Sinuses/Orbits: Globes and orbital soft tissues demonstrate no acute finding. Mild axial myopia noted. Paranasal sinuses are largely clear. No mastoid effusion. Inner ear structures normal. Other: None. MRA HEAD FINDINGS ANTERIOR CIRCULATION: Distal cervical segments of the internal carotid arteries are patent with symmetric antegrade flow. Distal cervical right ICA mildly tortuous. Petrous, cavernous, and supraclinoid segments patent without hemodynamically significant stenosis. A1 segments patent bilaterally. Normal anterior communicating artery. Anterior cerebral arteries widely patent to their distal aspects. No M1 stenosis or occlusion. Normal MCA bifurcations. Distal MCA branches well perfused and symmetric. POSTERIOR CIRCULATION: Vertebral arteries largely code dominant and widely patent to the vertebrobasilar junction. Posterior inferior cerebral arteries patent bilaterally. Basilar patent to its distal aspect without stenosis. 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 Superior cerebral arteries patent bilaterally. PCAs supplied via hypoplastic P1 segments as well as prominent bilateral posterior communicating arteries. PCAs well perfused to their distal aspects without stenosis. No intracranial  aneurysm or other vascular abnormality. IMPRESSION: MRI HEAD IMPRESSION: 1. 2 cm subacute ischemic nonhemorrhagic infarct involving the ventromedial left thalamus. 2. Otherwise normal brain MRI for age. MRA HEAD IMPRESSION: Normal intracranial MRA. No large vessel occlusion. No hemodynamically significant or correctable stenosis. Electronically Signed   By: Rise MuBenjamin  McClintock M.D.   On: 05/10/2019 03:04     Vas [place] Transcranial Doppler W Bubbles Result Date: 05/11/2019  Transcranial Doppler with Bubble Indications: Stroke. Performing Technologist: Blanch MediaMegan Riddle RVS  Examination Guidelines: A complete evaluation includes B-mode imaging, spectral Doppler, color Doppler, and power Doppler as needed of all accessible portions of each vessel. Bilateral testing is considered an integral part of a complete examination. Limited examinations for reoccurring indications may be performed as noted.  Summary:  A vascular evaluation was performed. The right middle cerebral artery was studied. An IV was inserted into the patient's left forearm. Verbal informed consent was obtained.  HITS heard during valsalva PFO size: small Positive TCD Bubble study with valsalva only indicative of a small right to left shunt *See table(s) above for measurements and observations.  Diagnosing physician: Delia HeadyPramod Sethi MD Electronically signed by Delia HeadyPramod Sethi MD on 05/11/2019 at 12:47:30 PM.    Final      [facility] [place]otid [facility] Result Date: 05/10/2019 Carotid Arterial Duplex Study Indications: CVA and Memory Dysfunction. Performing Technologist: Toma DeitersVirginia Slaughter RVS  Examination Guidelines: A complete evaluation includes B-mode imaging, spectral Doppler, color Doppler, and power Doppler as needed of all accessible portions of each vessel. Bilateral testing is considered an integral part of a complete examination. Limited examinations for reoccurring indications may be performed as noted.  Right Carotid Summary: Right Carotid:  There is no evidence of stenosis in the right ICA. Left Carotid: There is no evidence of stenosis in the left ICA. Vertebrals:  Bilateral vertebral arteries demonstrate antegrade flow. Subclavians: Normal flow hemodynamics were seen in bilateral subclavian              arteries. *See table(s) above for measurements and observations.     Preliminary      Vas [place] Lower Extremity Venous (dvt) Result Date: 05/11/2019  Lower Venous Study Indications: Stroke.  Performing Technologist: Blanch MediaMegan Riddle RVS  Examination Guidelines: A complete evaluation includes B-mode imaging, spectral Doppler, color Doppler, and power Doppler as needed of all accessible portions of each vessel. Bilateral testing is considered an integral part of a complete examination. Limited examinations for reoccurring indications may be performed as noted.  Summary: Right: There is no evidence of deep vein thrombosis in the lower extremity. No cystic structure found in the popliteal fossa. Left: There is no evidence of deep vein thrombosis in the lower extremity. No cystic structure found in the popliteal fossa.  *See table(s) above for measurements and observations. Electronically signed by Gretta Beganodd Early MD on 05/11/2019 at 5:02:04 PM.    Final     12-lead ECG SR All prior EKG's in EPIC reviewed with no documented atrial fibrillation  Telemetry ST, periods of ST 130s  Assessment and Plan:  1. Cryptogenic stroke The patient presents with cryptogenic stroke.  The patient has a TEE planned for this AM.  I spoke at length with the patient about rational foir heart monitoring w/stroke, options of monitoring for afib with either a 30 day event monitor or an implantable loop recorder.  Risks, benefits, and alteratives to implantable loop recorder were discussed with the patient today.   At this time, the patient is very hesitant to want to decide about monitoring of any kind, and particularly about loop implant.  She Eulon Allnutt discuss further with Dr.  Elberta Fortisamnitz, though at this juncture, not quite sure she wants loop or monitoring of any kind.   ADDEND The patient after speaking with Dr. Elberta Fortisamnitz and having opportunity to give it some thought she has decided to proceed with loop implant pending the TEE findings.  We discussed wound care, Lilleigh Hechavarria make wound check visit for her.   Sheilah PigeonRenee Lynn Ursuy, PA-C 05/13/2019  I have seen and examined this patient with Francis Dowseenee Ursuy.  Agree with above, note added to reflect my findings.  On exam, RRR, no murmurs, lungs clear.  Patient presented to the hospital with cryptogenic stroke. To date, no cause has been found. TEE planned for today. If unrevealing, Shyleigh Daughtry plan for LINQ monitor to look for atrial fibrillation. Risks and benefits discussed. Risks include but not limited to bleeding and infection. The patient understands the risks and has agreed to the procedure.  Imer Foxworth M. Tyreonna Czaplicki MD 05/13/2019 11:06 AM

## 2019-05-13 ENCOUNTER — Encounter (HOSPITAL_COMMUNITY)
Admission: EM | Disposition: A | Discharge status: Home / Self Care | Payer: Self-pay | Source: Home / Self Care | Attending: Internal Medicine

## 2019-05-13 ENCOUNTER — Inpatient Hospital Stay (HOSPITAL_COMMUNITY): Payer: BC Managed Care – PPO | Admitting: Certified Registered Nurse Anesthetist

## 2019-05-13 ENCOUNTER — Inpatient Hospital Stay (HOSPITAL_COMMUNITY): Payer: BC Managed Care – PPO

## 2019-05-13 DIAGNOSIS — I6389 Other cerebral infarction: Secondary | ICD-10-CM

## 2019-05-13 DIAGNOSIS — Q2112 Patent foramen ovale: Secondary | ICD-10-CM

## 2019-05-13 DIAGNOSIS — R768 Other specified abnormal immunological findings in serum: Secondary | ICD-10-CM

## 2019-05-13 DIAGNOSIS — R413 Other amnesia: Secondary | ICD-10-CM

## 2019-05-13 DIAGNOSIS — R748 Abnormal levels of other serum enzymes: Secondary | ICD-10-CM

## 2019-05-13 DIAGNOSIS — Q211 Atrial septal defect: Secondary | ICD-10-CM

## 2019-05-13 HISTORY — PX: TEE WITHOUT CARDIOVERSION: SHX5443

## 2019-05-13 HISTORY — PX: BUBBLE STUDY: SHX6837

## 2019-05-13 HISTORY — PX: LOOP RECORDER INSERTION: EP1214

## 2019-05-13 LAB — BETA-2-GLYCOPROTEIN I ABS, IGG/M/A
Beta-2 Glyco I IgG: <9 GPI IgG units (ref 0–20)
Beta-2-Glycoprotein I IgA: <9 GPI IgA units (ref 0–25)
Beta-2-Glycoprotein I IgM: <9 GPI IgM units (ref 0–32)

## 2019-05-13 SURGERY — ECHOCARDIOGRAM, TRANSESOPHAGEAL
Anesthesia: Monitor Anesthesia Care

## 2019-05-13 SURGERY — LOOP RECORDER INSERTION
Site: Chest | Laterality: Left | Wound class: Clean

## 2019-05-13 MED ORDER — LIDOCAINE-EPINEPHRINE 1 %-1:100000 IJ SOLN
INTRAMUSCULAR | Status: DC | PRN
  Administered 2019-05-13: 10 mL

## 2019-05-13 MED ORDER — ATORVASTATIN CALCIUM 80 MG PO TABS: 80.0000 mg | ORAL_TABLET | Freq: Every day | ORAL | 2 refills | Status: DC

## 2019-05-13 MED ORDER — BUTAMBEN-TETRACAINE-BENZOCAINE 2-2-14 % EX AERO
INHALATION_SPRAY | CUTANEOUS | Status: DC | PRN
  Administered 2019-05-13: 2 via TOPICAL

## 2019-05-13 MED ORDER — PROPOFOL 500 MG/50ML IV EMUL
INTRAVENOUS | Status: DC | PRN
  Administered 2019-05-13: 125 ug/kg/min via INTRAVENOUS

## 2019-05-13 MED ORDER — ASPIRIN 81 MG PO CHEW: 81.0000 mg | CHEWABLE_TABLET | Freq: Every day | ORAL | 2 refills | Status: AC

## 2019-05-13 MED ORDER — SODIUM CHLORIDE 0.9 % IV SOLN
INTRAVENOUS | Status: DC | PRN
  Administered 2019-05-13: 10:00:00 via INTRAVENOUS

## 2019-05-13 MED ORDER — CLOPIDOGREL BISULFATE 75 MG PO TABS: 75.0000 mg | ORAL_TABLET | Freq: Every day | ORAL | 0 refills | Status: AC

## 2019-05-13 MED ORDER — LACTATED RINGERS IV SOLN
INTRAVENOUS | Status: DC
  Administered 2019-05-13: 09:00:00 via INTRAVENOUS

## 2019-05-13 MED ORDER — LIDOCAINE-EPINEPHRINE 1 %-1:100000 IJ SOLN
INTRAMUSCULAR | Status: AC
  Filled 2019-05-13: qty 1

## 2019-05-13 MED ORDER — LIDOCAINE 2% (20 MG/ML) 5 ML SYRINGE
INTRAMUSCULAR | Status: DC | PRN
  Administered 2019-05-13: 70 mg via INTRAVENOUS

## 2019-05-13 MED ORDER — PROPOFOL 10 MG/ML IV BOLUS
INTRAVENOUS | Status: DC | PRN
  Administered 2019-05-13 (×2): 10 mg via INTRAVENOUS
  Administered 2019-05-13: 20 mg via INTRAVENOUS

## 2019-05-13 SURGICAL SUPPLY — 2 items
LOOP REVEAL LINQSYS (Prosthesis & Implant Heart) ×3 IMPLANT
PACK LOOP INSERTION (CUSTOM PROCEDURE TRAY) ×3 IMPLANT

## 2019-05-13 NOTE — Progress Notes (Signed)
STROKE TEAM PROGRESS NOTE   INTERVAL HISTORY She had TEE today which showed a PFO but no other cardiac source of embolism.  ANA is positive in a speckled pattern and a titer of 1 : 1280.  Sickle cell screen, anticardiolipin antibodies and antiphospholipid antibodies and RPR were all negative Vitals:   05/13/19 1008 05/13/19 1013 05/13/19 1020 05/13/19 1119  BP: 118/80 115/84 119/82 (!) 120/93  Pulse: (!) 109 99 96 85  Resp: (!) 29 (!) 24 (!) 29 16  Temp: 98.3 F (36.8 C)   98.6 F (37 C)  TempSrc: Temporal   Oral  SpO2: 98% 100% 100% 98%  Weight:      Height:        CBC:  Recent Labs  Lab 05/09/19 1611  05/11/19 0422 05/12/19 0352  WBC 6.4  --  5.5 5.6  NEUTROABS 3.7  --   --  3.1  HGB 12.1   < > 12.2 12.7  HCT 37.2   < > 36.5 37.9  MCV 92.1  --  89.7 89.8  PLT 356  --  405* 433*   < > = values in this interval not displayed.    Basic Metabolic Panel:  Recent Labs  Lab 05/11/19 0422 05/12/19 0352  NA 138 138  K 3.6 3.8  CL 104 104  CO2 24 23  GLUCOSE 106* 104*  BUN 13 18  CREATININE 0.85 0.89  CALCIUM 9.6 9.5  MG 2.2 2.1  PHOS  --  4.0   Lipid Panel:     Component Value Date/Time   CHOL 173 05/10/2019 0341   TRIG 52 05/10/2019 0341   HDL 46 05/10/2019 0341   CHOLHDL 3.8 05/10/2019 0341   VLDL 10 05/10/2019 0341   LDLCALC 117 (H) 05/10/2019 0341   HgbA1c:  Lab Results  Component Value Date   HGBA1C 5.8 (H) 05/10/2019   Urine Drug Screen:     Component Value Date/Time   LABOPIA NONE DETECTED 05/10/2019 0837   COCAINSCRNUR NONE DETECTED 05/10/2019 0837   LABBENZ NONE DETECTED 05/10/2019 0837   AMPHETMU NONE DETECTED 05/10/2019 0837   THCU NONE DETECTED 05/10/2019 0837   LABBARB NONE DETECTED 05/10/2019 0837    Alcohol Level No results found for: ETH  IMAGING No results found.  PHYSICAL EXAM Pleasant middle-aged African-American lady not in distress. . Afebrile. Head is nontraumatic. Neck is supple without bruit.    Cardiac exam no murmur or  gallop. Lungs are clear to auscultation. Distal pulses are well felt. Neurological Exam ;  Awake  Alert oriented x 3. Normal speech and language.diminished attention, registration and recall is poor 0/3.  She is able to name only 2 animals which walk on 4 legs.  She can follow two-step commands.  Eye movements full without nystagmus.fundi were not visualized. Vision acuity and fields appear normal. Hearing is normal. Palatal movements are normal. Face symmetric. Tongue midline. Normal strength, tone, reflexes and coordination. Normal sensation. Gait deferred.  ASSESSMENT/PLAN [name] is a 48 y.o. female with history of hernia surgery 04/26/2019 presenting with memory dysfunction since Thursday 05/05/2019.   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 No source of embolus   TEE : Positive  for PFO.  No clot  TCD bubble positive for small right-to-left shunt with Valsalva only   LE doppler negative for DVT   LDL 117  HgbA1c 5.8  HIV neg  Hypercoagulable labs (ANA, ESR, RPR, lupus anticoagulant, beta-2 glycoprotein, cardiolipin antibody, sickle cell screen ) all negative except elevated ESR of 52 mm and positive ANA  Lovenox 40 mg sq daily for VTE prophylaxis  No antithrombotic prior to admission, now on aspirin 81 mg daily. Given mild stroke, recommend aspirin 81 mg and plavix 75 mg daily x 3 weeks, then aspirin alone. Orders adjusted.   Therapy recommendations:  No PT, No OT, OP SLP  Disposition:  pending   Possible undiagnosed chronic hypertension  Home meds:  none  Stable . Permissive hypertension (OK if < 220/120) but gradually normalize in 5-7 days . Long-term BP goal  normotensive  Hyperlipidemia  Home meds:  No statin  Now on Lipitor 80  LDL 117, goal < 70  Continue statin at discharge  Other Stroke Risk Factors  ETOH use, advised to drink no more than 1 drink(s) a day  Obesity, Body mass index is 33.51 kg/m., recommend weight loss, diet and exercise as appropriate   Other Active Problems  Recent hernia surgery 04/26/2019  Hospital day # 3   .  Continue aspirin and Plavix for stroke prevention for 3 weeks followed by aspirin alone.  She has a positive ANA and elevated ESR but no other signs of active rheumatological disease.  May need outpatient referral to a rheumatologist.  We will also discuss elective referral to interventional cardiology for PFO closure at follow-up visit in the office in 6 weeks.  Aggressive risk factor modification.  [name] Kitchendiscussion with patient and with Dr. Marylyn Ishihara and answered questions.   Stroke team will sign off. Call for questions.  Antony Contras, MD Medical Director [facility] Pager: [phone] 05/13/2019 1:16 PM   To contact Stroke Continuity provider, please refer to [email or web address] After hours, contact General Neurology

## 2019-05-13 NOTE — Discharge Instructions (Signed)
Post implant wound/site care instructions °Keep incision clean and dry for 3 days. °You can remove outer dressing tomorrow. °Leave steri-strips (little pieces of tape) on until seen in the office for wound check appointment. °Call the office ([phone]) for redness, drainage, swelling, or fever. ° °

## 2019-05-13 NOTE — Discharge Summary (Signed)
. Physician Discharge Summary  Carolyn Mclaughlin OJJ:[id number] DOB: 03-23-1971 DOA: 05/09/2019  PCP: [facility]  Admit date: 05/09/2019 Discharge date: 05/13/2019  Admitted From: Home Disposition:  Discharged to home with [facility].  Recommendations for Outpatient Follow-up:  1. Follow up with PCP in 1-2 weeks 2. Please obtain BMP/CBC in one week  Discharge Condition: Stable  CODE STATUS: FULL  Brief/Interim Summary: Carolyn Mclaughlin a 48 y.o.femalewith medical history significant ofincarcerated epigastric hernia s/p repair x2 weeks ago.  Starting on Thursday (5 days ago) she began to have new memory difficulty. Symptoms have persisted since onset. No associated limb weakness, facial droop, vision loss, difficulty walking, aphasia nor dysarthria.  Discharge Diagnoses:  Principal Problem:   Acute ischemic stroke [facility]) Active Problems:   Elevated liver enzymes   Memory problem   PFO (patent foramen ovale)   Elevated antinuclear antibody (ANA) level  Acute ischemic stroke - L thalamic infarct; thought 2/2 to small vessel disease, but concern for embolic source given recent surgery/post op state - MRI with 2 cm subacute ischemic nonhemorrhagic infarct involving the ventromedial L thalamus, no LVO, no hemodynamically significant or correctable stenosis - Echo - EF 55-60% (see report) - Carotid US with no evidence of stenosis in L or R ICA, bilateral vertebral arteries with antegrade flow (pending final read) - transcranial doppler with bubbles:Positive TCD Bubble study with valsalva only indicative of a small right to left shunt - BLE dopplers:There is no evidence of deep vein thrombosis in the lower extremities - PT/OT/SLP - recommending outpatient SLP  - Neurology c/s, appreciate recommendations - Positive ANA, speckled Pattern 1:1280; neuro rec's outpt rheum follow up - ASA/plavix per neurology - A1c 5.8, LDL  117 - PFO seen on echo; neuro recs TEE and loop recorder; she is now s/p TEE and loop recorder placement      - neuro recs outpt follow /w neuro and rheumatology     - does not need to return to work until seen by PCP; recommend PCP follow up in 1 week. Do not drive during this time.   Mildly elevated liver enzymes, alk phos  - down trending  Memory Problems     - short term memory issues since stroke  Discharge Instructions 1. Follow up with PCP in 1 week. Do not return to work or drive until seen by PCP 2. Follow up with Neurology in 2 weeks. 3. Follow up with Rheumatology  Discharge Instructions    Ambulatory referral to Speech Therapy   Complete by: As directed      Allergies as of 05/13/2019   No Known Allergies     Medication List    STOP taking these medications   ibuprofen 200 MG tablet Commonly known as: ADVIL     TAKE these medications   aspirin 81 MG chewable tablet Chew 1 tablet (81 mg total) by mouth daily. Start taking on: May 14, 2019   atorvastatin 80 MG tablet Commonly known as: LIPITOR Take 1 tablet (80 mg total) by mouth at bedtime.   clopidogrel 75 MG tablet Commonly known as: PLAVIX Take 1 tablet (75 mg total) by mouth daily for 21 days. Start taking on: May 14, 2019   nabumetone 750 MG tablet Commonly known as: RELAFEN Take 750 mg by mouth 2 (two) times daily as needed for pain.      Follow-up Information    [facility] Follow up.   Specialty: Rehabilitation Contact information: [address]  [place] [phone] [place] [facility] Follow up.   Specialty: Cardiology Why: 05/26/2019 @ 10:00AM, wound check visit (heart monitor implant) Contact information: [address] [place] [phone]         No Known  Allergies  Consultations:  Neurology  Cardiology   Procedures/Studies: Dg Chest 2 View  Result Date: 05/10/2019 CLINICAL DATA:  Initial evaluation for acute stroke. EXAM: CHEST - 2 VIEW COMPARISON:  Prior radiograph 02/18/2012. FINDINGS: The cardiac and mediastinal silhouettes are stable in size and contour, and remain within normal limits. The lungs are normally inflated. No airspace consolidation, pleural effusion, or pulmonary edema is identified. There is no pneumothorax. No acute osseous abnormality identified. IMPRESSION: No active cardiopulmonary disease. Electronically Signed   By: Jeannine Boga M.D.   On: 05/10/2019 01:39   Ct Head Wo Contrast  Result Date: 05/09/2019 CLINICAL DATA:  Pt in with daughter reporting memory loss in patient for the last few days EXAM: CT HEAD WITHOUT CONTRAST TECHNIQUE: Contiguous axial images were obtained from the base of the skull through the vertex without intravenous contrast. COMPARISON:  None. FINDINGS: Brain: Hypoattenuation in the left basal ganglia and thalamus, possibly subacute infarct. No acute hemorrhage. No hydrocephalus, extra-axial collection, mass lesion, or mass effect. Vascular: No hyperdense vessel or unexpected calcification. Skull: Normal. Negative for fracture or focal lesion. Sinuses/Orbits: No acute finding. Other: None IMPRESSION: Hypoattenuation in the left basal ganglia and thalamus, possibly subacute infarct. No acute hemorrhage. Electronically Signed   By: Lucrezia Europe M.D.   On: 05/09/2019 18:57   Mr Virgel Paling KZ Contrast  Result Date: 05/10/2019 CLINICAL DATA:  Initial evaluation for acute stroke, subacute stroke on prior CT. EXAM: MRI HEAD WITHOUT CONTRAST MRA HEAD WITHOUT CONTRAST TECHNIQUE: Multiplanar, multiecho pulse sequences of the brain and surrounding structures were obtained without intravenous contrast. Angiographic images of the head were obtained using MRA technique without contrast. COMPARISON:  Prior CT from  05/09/2019 FINDINGS: MRI HEAD FINDINGS Brain: Cerebral volume within normal limits for age. No significant cerebral white matter changes. 2 cm subacute ischemic nonhemorrhagic infarcts seen involving the ventral medial left thalamus, corresponding with abnormality on prior CT. Associated T2/FLAIR signal abnormality without significant regional mass effect or edema. No other evidence for acute or subacute ischemia. Gray-white matter differentiation otherwise maintained. No other areas of remote or chronic infarction. No foci of susceptibility artifact to suggest acute or chronic intracranial hemorrhage. No mass lesion, midline shift or mass effect. Ventricles normal size without hydrocephalus. No extra-axial fluid collection. Pituitary gland suprasellar region normal. Midline structures intact and normal. Vascular: Major intracranial vascular flow voids are well maintained. Skull and upper cervical spine: Craniocervical junction normal. Upper cervical spine within normal limits. No focal marrow replacing lesion. No scalp soft tissue abnormality. Sinuses/Orbits: Globes and orbital soft tissues demonstrate no acute finding. Mild axial myopia noted. Paranasal sinuses are largely clear. No mastoid effusion. Inner ear structures normal. Other: None. MRA HEAD FINDINGS ANTERIOR CIRCULATION: Distal cervical segments of the internal carotid arteries are patent with symmetric antegrade flow. Distal cervical right ICA mildly tortuous. Petrous, cavernous, and supraclinoid segments patent without hemodynamically significant stenosis. A1 segments patent bilaterally. Normal anterior communicating artery. Anterior cerebral arteries widely patent to their distal aspects. No M1 stenosis or occlusion. Normal MCA bifurcations. Distal MCA branches well perfused and symmetric. POSTERIOR CIRCULATION: Vertebral arteries largely code dominant and widely patent to the  vertebrobasilar junction. Posterior inferior cerebral arteries patent  bilaterally. Basilar patent to its distal aspect without stenosis. 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 Superior cerebral arteries patent bilaterally. PCAs supplied via hypoplastic P1 segments as well as prominent bilateral posterior communicating arteries. PCAs well perfused to their distal aspects without stenosis. No intracranial aneurysm or other vascular abnormality. IMPRESSION: MRI HEAD IMPRESSION: 1. 2 cm subacute ischemic nonhemorrhagic infarct involving the ventromedial left thalamus. 2. Otherwise normal brain MRI for age. MRA HEAD IMPRESSION: Normal intracranial MRA. No large vessel occlusion. No hemodynamically significant or correctable stenosis. Electronically Signed   By: Jeannine Boga M.D.   On: 05/10/2019 03:04   Mr Brain Wo Contrast  Result Date: 05/10/2019 CLINICAL DATA:  Initial evaluation for acute stroke, subacute stroke on prior CT. EXAM: MRI HEAD WITHOUT CONTRAST MRA HEAD WITHOUT CONTRAST TECHNIQUE: Multiplanar, multiecho pulse sequences of the brain and surrounding structures were obtained without intravenous contrast. Angiographic images of the head were obtained using MRA technique without contrast. COMPARISON:  Prior CT from 05/09/2019 FINDINGS: MRI HEAD FINDINGS Brain: Cerebral volume within normal limits for age. No significant cerebral white matter changes. 2 cm subacute ischemic nonhemorrhagic infarcts seen involving the ventral medial left thalamus, corresponding with abnormality on prior CT. Associated T2/FLAIR signal abnormality without significant regional mass effect or edema. No other evidence for acute or subacute ischemia. Gray-white matter differentiation otherwise maintained. No other areas of remote or chronic infarction. No foci of susceptibility artifact to suggest acute or chronic intracranial hemorrhage. No mass lesion, midline shift or mass effect. Ventricles normal size without hydrocephalus. No extra-axial fluid collection. Pituitary  gland suprasellar region normal. Midline structures intact and normal. Vascular: Major intracranial vascular flow voids are well maintained. Skull and upper cervical spine: Craniocervical junction normal. Upper cervical spine within normal limits. No focal marrow replacing lesion. No scalp soft tissue abnormality. Sinuses/Orbits: Globes and orbital soft tissues demonstrate no acute finding. Mild axial myopia noted. Paranasal sinuses are largely clear. No mastoid effusion. Inner ear structures normal. Other: None. MRA HEAD FINDINGS ANTERIOR CIRCULATION: Distal cervical segments of the internal carotid arteries are patent with symmetric antegrade flow. Distal cervical right ICA mildly tortuous. Petrous, cavernous, and supraclinoid segments patent without hemodynamically significant stenosis. A1 segments patent bilaterally. Normal anterior communicating artery. Anterior cerebral arteries widely patent to their distal aspects. No M1 stenosis or occlusion. Normal MCA bifurcations. Distal MCA branches well perfused and symmetric. POSTERIOR CIRCULATION: Vertebral arteries largely code dominant and widely patent to the vertebrobasilar junction. Posterior inferior cerebral arteries patent bilaterally. Basilar patent to its distal aspect without stenosis. 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 Superior cerebral arteries patent bilaterally. PCAs supplied via hypoplastic P1 segments as well as prominent bilateral posterior communicating arteries. PCAs well perfused to their distal aspects without stenosis. No intracranial aneurysm or other vascular abnormality. IMPRESSION: MRI HEAD IMPRESSION: 1. 2 cm subacute ischemic nonhemorrhagic infarct involving the ventromedial left thalamus. 2. Otherwise normal brain MRI for age. MRA HEAD IMPRESSION: Normal intracranial MRA. No large vessel occlusion. No hemodynamically significant or correctable stenosis. Electronically Signed   By: Jeannine Boga M.D.   On:  05/10/2019 03:04   Ct Abdomen Pelvis W Contrast  Result Date: 04/21/2019 CLINICAL DATA:  Epigastric region pain. Reported periumbilical hernia EXAM: CT ABDOMEN AND PELVIS WITH CONTRAST TECHNIQUE: Multidetector CT imaging of the abdomen and pelvis was performed using the standard protocol following bolus administration of intravenous contrast. Oral contrast also administered. CONTRAST:  133m ISOVUE-300 IOPAMIDOL (ISOVUE-300) INJECTION  61% COMPARISON:  None. FINDINGS: Lower chest: Lung bases are clear. Hepatobiliary: No focal liver lesions are apparent. The gallbladder wall is not appreciably thickened. There is no biliary duct dilatation. Pancreas: There is no pancreatic mass or inflammatory focus. Spleen: No splenic lesions are evident. Adrenals/Urinary Tract: Adrenals bilaterally appear unremarkable. Kidneys bilaterally show no evident mass or hydronephrosis on either side. There is no appreciable renal or ureteral calculus on either side. Urinary bladder is midline. Urinary bladder wall is thickened. Stomach/Bowel: There is no appreciable bowel wall or mesenteric thickening. There is no evident bowel obstruction. Terminal ileum appears normal. There is no free air or portal venous air. Vascular/Lymphatic: There is no abdominal aortic aneurysm. No vascular lesions are evident. There is no adenopathy in the abdomen or pelvis. Reproductive: Uterus is in the midline. There is no evident pelvic mass. Other: Appendix appears normal. No abscess or ascites in the abdomen or pelvis. At the level of the umbilicus, there is a small ventral hernia containing only fat. There is a midline ventral hernia located 3.6 cm superior to the umbilicus. 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 The wall of this hernia is somewhat thickened. This hernia at its neck measures 1.8 cm from right to left dimension and 1.1 cm from superior to inferior dimension. The  hernia overall measures 3.8 x 3.5 x 3.4 cm. No bowel extends into this hernia. No hernia containing bowel is evident on this study. Musculoskeletal: There are no blastic or lytic bone lesions. No intramuscular lesions are evident. IMPRESSION: 1. Midline ventral hernia slightly superior to the umbilicus containing fat and soft tissue thickening consistent with panniculitis. No bowel containing hernia evident. 2. Small midline ventral hernia at the level of the umbilicus containing only fat. 3. Bowel obstruction. No abscess in the abdomen or pelvis. Appendix appears normal. 4. Urinary bladder wall is thickened consistent with a degree of cystitis. 5.  No evident renal or ureteral calculus.  No hydronephrosis. Electronically Signed   By: Lowella Grip III M.D.   On: 04/21/2019 11:30   Vas [place] Transcranial Doppler W Bubbles  Result Date: 05/11/2019  Transcranial Doppler with Bubble Indications: Stroke. Performing Technologist: Abram Sander RVS  Examination Guidelines: A complete evaluation includes B-mode imaging, spectral Doppler, color Doppler, and power Doppler as needed of all accessible portions of each vessel. Bilateral testing is considered an integral part of a complete examination. Limited examinations for reoccurring indications may be performed as noted.  Summary:  A vascular evaluation was performed. The right middle cerebral artery was studied. An IV was inserted into the patient's left forearm. Verbal informed consent was obtained.  HITS heard during valsalva PFO size: small Positive TCD Bubble study with valsalva only indicative of a small right to left shunt *See table(s) above for measurements and observations.  Diagnosing physician: Antony Contras MD Electronically signed by Antony Contras MD on 05/11/2019 at 12:47:30 PM.    Final    Vas US Carotid (at [place] Only)  Result Date: 05/12/2019 Carotid Arterial Duplex Study Indications: CVA and Memory Dysfunction. Performing Technologist: Toma Copier RVS  Examination Guidelines: A complete evaluation includes B-mode imaging, spectral Doppler, color Doppler, and power Doppler as needed of all accessible portions of each vessel. Bilateral testing is considered an integral part of a complete examination. Limited examinations for reoccurring indications may be performed as noted.  Right Carotid Findings: +----------+--------+--------+--------+--------+-------------------------+  PSV cm/sEDV cm/sStenosisDescribeComments                  +----------+--------+--------+--------+--------+-------------------------+ CCA Prox  48      11                      mild intimal wall changes +----------+--------+--------+--------+--------+-------------------------+ CCA Distal48      17                                                +----------+--------+--------+--------+--------+-------------------------+ ICA Prox  48      19                                                +----------+--------+--------+--------+--------+-------------------------+ ICA Mid   67      25                                                +----------+--------+--------+--------+--------+-------------------------+ ICA Distal45      21                      tortuous                  +----------+--------+--------+--------+--------+-------------------------+ ECA       64      15                                                +----------+--------+--------+--------+--------+-------------------------+ +----------+--------+-------+--------+-------------------+           PSV cm/sEDV cmsDescribeArm Pressure (mmHG) +----------+--------+-------+--------+-------------------+ [id number]                                         +----------+--------+-------+--------+-------------------+ +---------+--------+--+--------+--+---------+ VertebralPSV cm/s34EDV cm/s10Antegrade +---------+--------+--+--------+--+---------+  Left Carotid Findings:  +----------+--------+--------+--------+--------+-------------------------+           PSV cm/sEDV cm/sStenosisDescribeComments                  +----------+--------+--------+--------+--------+-------------------------+ CCA Prox  82      71                      mild intimal wall changes +----------+--------+--------+--------+--------+-------------------------+ CCA Distal53      17                      mild intimal wall changes +----------+--------+--------+--------+--------+-------------------------+ ICA Prox  51      23                                                +----------+--------+--------+--------+--------+-------------------------+ ICA Mid   85      38  tortuous                  +----------+--------+--------+--------+--------+-------------------------+ ICA Distal86      35                      tortuous                  +----------+--------+--------+--------+--------+-------------------------+ ECA       43      15                                                +----------+--------+--------+--------+--------+-------------------------+ +----------+--------+--------+--------+-------------------+ SubclavianPSV cm/sEDV cm/sDescribeArm Pressure (mmHG) +----------+--------+--------+--------+-------------------+           85                                          +----------+--------+--------+--------+-------------------+ +---------+--------+--+--------+--+---------+ VertebralPSV cm/s47EDV cm/s18Antegrade +---------+--------+--+--------+--+---------+  Summary: Right Carotid: There is no evidence of stenosis in the right ICA. Left Carotid: There is no evidence of stenosis in the left ICA. Vertebrals:  Bilateral vertebral arteries demonstrate antegrade flow. Subclavians: Normal flow hemodynamics were seen in bilateral subclavian              arteries. *See table(s) above for measurements and observations.  Electronically signed by  Antony Contras MD on 05/12/2019 at 12:36:39 PM.    Final    Vas [place] Lower Extremity Venous (dvt)  Result Date: 05/11/2019  Lower Venous Study Indications: Stroke.  Performing Technologist: Abram Sander RVS  Examination Guidelines: A complete evaluation includes B-mode imaging, spectral Doppler, color Doppler, and power Doppler as needed of all accessible portions of each vessel. Bilateral testing is considered an integral part of a complete examination. Limited examinations for reoccurring indications may be performed as noted.  +---------+---------------+---------+-----------+----------+--------------+ RIGHT    CompressibilityPhasicitySpontaneityPropertiesSummary        +---------+---------------+---------+-----------+----------+--------------+ CFV      Full           Yes      Yes                                 +---------+---------------+---------+-----------+----------+--------------+ SFJ      Full                                                        +---------+---------------+---------+-----------+----------+--------------+ FV Prox  Full                                                        +---------+---------------+---------+-----------+----------+--------------+ FV Mid   Full                                                        +---------+---------------+---------+-----------+----------+--------------+  FV DistalFull                                                        +---------+---------------+---------+-----------+----------+--------------+ PFV      Full                                                        +---------+---------------+---------+-----------+----------+--------------+ POP      Full           Yes      Yes                                 +---------+---------------+---------+-----------+----------+--------------+ PTV      Full                                                         +---------+---------------+---------+-----------+----------+--------------+ PERO                                                  Not visualized +---------+---------------+---------+-----------+----------+--------------+   +---------+---------------+---------+-----------+----------+-------+ LEFT     CompressibilityPhasicitySpontaneityPropertiesSummary +---------+---------------+---------+-----------+----------+-------+ CFV      Full           Yes      Yes                          +---------+---------------+---------+-----------+----------+-------+ SFJ      Full                                                 +---------+---------------+---------+-----------+----------+-------+ FV Prox  Full                                                 +---------+---------------+---------+-----------+----------+-------+ FV Mid   Full                                                 +---------+---------------+---------+-----------+----------+-------+ FV DistalFull                                                 +---------+---------------+---------+-----------+----------+-------+ PFV      Full                                                 +---------+---------------+---------+-----------+----------+-------+  POP      Full           Yes      Yes                          +---------+---------------+---------+-----------+----------+-------+ PTV      Full                                                 +---------+---------------+---------+-----------+----------+-------+ PERO     Full                                                 +---------+---------------+---------+-----------+----------+-------+     Summary: Right: There is no evidence of deep vein thrombosis in the lower extremity. No cystic structure found in the popliteal fossa. Left: There is no evidence of deep vein thrombosis in the lower extremity. No cystic structure found in the popliteal fossa.  *See  table(s) above for measurements and observations. Electronically signed by Curt Jews MD on 05/11/2019 at 5:02:04 PM.    Final     TEE: Positive PFO. L to R shunt with color flow Doppler. R to L shunt at rest with agitated saline injection. Valsalva not performed. No LAA thrombus. No hemodynamically significant valvular heart disease.  EF 55-60%.    Subjective: "So I get to go home?"  Discharge Exam: Vitals:   05/13/19 1020 05/13/19 1119  BP: 119/82 (!) 120/93  Pulse: 96 85  Resp: (!) 29 16  Temp:  98.6 F (37 C)  SpO2: 100% 98%   Vitals:   05/13/19 1008 05/13/19 1013 05/13/19 1020 05/13/19 1119  BP: 118/80 115/84 119/82 (!) 120/93  Pulse: (!) 109 99 96 85  Resp: (!) 29 (!) 24 (!) 29 16  Temp: 98.3 F (36.8 C)   98.6 F (37 C)  TempSrc: Temporal   Oral  SpO2: 98% 100% 100% 98%  Weight:      Height:        General:48 y.o.femaleresting in bed in NAD Cardiovascular: RRR, +S1, S2, no m/g/r, equal pulses throughout Respiratory: CTABL, no w/r/r, normal WOB GI: BS+, NDNT, no masses noted, no organomegaly noted MSK: No e/c/c Skin: No rashes, bruises, ulcerations noted Neuro: A&O x 3, no focal deficits    The results of significant diagnostics from this hospitalization (including imaging, microbiology, ancillary and laboratory) are listed below for reference.     Microbiology: Recent Results (from the past 240 hour(s))  SARS Coronavirus 2 (CEPHEID - Performed in [place] hospital lab), Hosp Order     Status: None   Collection Time: 05/10/19 12:29 AM   Specimen: Nasopharyngeal Swab  Result Value Ref Range Status   SARS Coronavirus 2 NEGATIVE NEGATIVE Final    Comment: (NOTE) If result is NEGATIVE SARS-CoV-2 target nucleic acids are NOT DETECTED. The SARS-CoV-2 RNA is generally detectable in upper and lower  respiratory specimens during the acute phase of infection. The lowest  concentration of SARS-CoV-2 viral copies this assay can detect is 250  copies / mL.  A negative result does not preclude SARS-CoV-2 infection  and should not be used as the sole basis for treatment or other  patient management decisions.  A  negative result may occur with  improper specimen collection / handling, submission of specimen other  than nasopharyngeal swab, presence of viral mutation(s) within the  areas targeted by this assay, and inadequate number of viral copies  (<250 copies / mL). A negative result must be combined with clinical  observations, patient history, and epidemiological information. If result is POSITIVE SARS-CoV-2 target nucleic acids are DETECTED. The SARS-CoV-2 RNA is generally detectable in upper and lower  respiratory specimens dur ing the acute phase of infection.  Positive  results are indicative of active infection with SARS-CoV-2.  Clinical  correlation with patient history and other diagnostic information is  necessary to determine patient infection status.  Positive results do  not rule out bacterial infection or co-infection with other viruses. If result is PRESUMPTIVE POSTIVE SARS-CoV-2 nucleic acids MAY BE PRESENT.   A presumptive positive result was obtained on the submitted specimen  and confirmed on repeat testing.  While 2019 novel coronavirus  (SARS-CoV-2) nucleic acids may be present in the submitted sample  additional confirmatory testing may be necessary for epidemiological  and / or clinical management purposes  to differentiate between  SARS-CoV-2 and other Sarbecovirus currently known to infect humans.  If clinically indicated additional testing with an alternate test  methodology [phone] is advised. The SARS-CoV-2 RNA is generally  detectable in upper and lower respiratory sp ecimens during the acute  phase of infection. The expected result is Negative. Fact Sheet for Patients:  StrictlyIdeas.no Fact Sheet for Healthcare Providers: BankingDealers.co.za This test is not  yet approved or cleared by the [place] FDA and has been authorized for detection and/or diagnosis of SARS-CoV-2 by FDA under an Emergency Use Authorization (EUA).  This EUA will remain in effect (meaning this test can be used) for the duration of the COVID-19 declaration under Section 564(b)(1) of the Act, 21 U.S.C. section 360bbb-3(b)(1), unless the authorization is terminated or revoked sooner. Performed at [facility], [place] [address] [place], [place] [address]      Labs: BNP (last 3 results) No results for input(s): BNP in the last 8760 hours. Basic Metabolic Panel: Recent Labs  Lab 05/09/19 1611 05/09/19 1630 05/11/19 0422 05/12/19 0352  NA 138 137 138 138  K 3.5 3.6 3.6 3.8  CL 102 102 104 104  CO2 25  --  24 23  GLUCOSE 105* 102* 106* 104*  BUN 8 10 13 18   CREATININE 0.85 0.80 0.85 0.89  CALCIUM 9.4  --  9.6 9.5  MG  --   --  2.2 2.1  PHOS  --   --   --  4.0   Liver Function Tests: Recent Labs  Lab 05/09/19 1611 05/11/19 0422 05/12/19 0352  AST 22 15  --   ALT 51* 32  --   ALKPHOS 158* 136*  --   BILITOT 0.5 0.9  --   PROT 7.4 7.0  --   ALBUMIN 3.7 3.4* 3.4*   No results for input(s): LIPASE, AMYLASE in the last 168 hours. No results for input(s): AMMONIA in the last 168 hours. CBC: Recent Labs  Lab 05/09/19 1611 05/09/19 1630 05/11/19 0422 05/12/19 0352  WBC 6.4  --  5.5 5.6  NEUTROABS 3.7  --   --  3.1  HGB 12.1 12.2 12.2 12.7  HCT 37.2 36.0 36.5 37.9  MCV 92.1  --  89.7 89.8  PLT 356  --  405* 433*   Cardiac Enzymes: Recent Labs  Lab 05/10/19 0341  TROPONINI <0.03   BNP: Invalid input(s): POCBNP CBG: Recent Labs  Lab 05/09/19 2349  GLUCAP 104*   D-Dimer No results for input(s): DDIMER in the last 72 hours. Hgb A1c No results for input(s): HGBA1C in the last 72 hours. Lipid Profile No results for input(s): CHOL, HDL, LDLCALC, TRIG, CHOLHDL, LDLDIRECT in the last 72 hours. Thyroid function studies No results for  input(s): TSH, T4TOTAL, T3FREE, THYROIDAB in the last 72 hours.  Invalid input(s): FREET3 Anemia work up No results for input(s): VITAMINB12, FOLATE, FERRITIN, TIBC, IRON, RETICCTPCT in the last 72 hours. Urinalysis No results found for: COLORURINE, APPEARANCEUR, [place], [place], GLUCOSEU, [place], [place], KETONESUR, PROTEINUR, UROBILINOGEN, NITRITE, LEUKOCYTESUR Sepsis Labs Invalid input(s): PROCALCITONIN,  WBC,  LACTICIDVEN Microbiology Recent Results (from the past 240 hour(s))  SARS Coronavirus 2 (CEPHEID - Performed in [place] hospital lab), Hosp Order     Status: None   Collection Time: 05/10/19 12:29 AM   Specimen: Nasopharyngeal Swab  Result Value Ref Range Status   SARS Coronavirus 2 NEGATIVE NEGATIVE Final    Comment: (NOTE) If result is NEGATIVE SARS-CoV-2 target nucleic acids are NOT DETECTED. The SARS-CoV-2 RNA is generally detectable in upper and lower  respiratory specimens during the acute phase of infection. The lowest  concentration of SARS-CoV-2 viral copies this assay can detect is 250  copies / mL. A negative result does not preclude SARS-CoV-2 infection  and should not be used as the sole basis for treatment or other  patient management decisions.  A negative result may occur with  improper specimen collection / handling, submission of specimen other  than nasopharyngeal swab, presence of viral mutation(s) within the  areas targeted by this assay, and inadequate number of viral copies  (<250 copies / mL). A negative result must be combined with clinical  observations, patient history, and epidemiological information. If result is POSITIVE SARS-CoV-2 target nucleic acids are DETECTED. The SARS-CoV-2 RNA is generally detectable in upper and lower  respiratory specimens dur ing the acute phase of infection.  Positive  results are indicative of active infection with SARS-CoV-2.  Clinical  correlation with patient history and other diagnostic information  is  necessary to determine patient infection status.  Positive results do  not rule out bacterial infection or co-infection with other viruses. If result is PRESUMPTIVE POSTIVE SARS-CoV-2 nucleic acids MAY BE PRESENT.   A presumptive positive result was obtained on the submitted specimen  and confirmed on repeat testing.  While 2019 novel coronavirus  (SARS-CoV-2) nucleic acids may be present in the submitted sample  additional confirmatory testing may be necessary for epidemiological  and / or clinical management purposes  to differentiate between  SARS-CoV-2 and other Sarbecovirus currently known to infect humans.  If clinically indicated additional testing with an alternate test  methodology [phone]) is advised. The SARS-CoV-2 RNA is generally  detectable in upper and lower respiratory sp ecimens during the acute  phase of infection. The expected result is Negative. Fact Sheet for Patients:  StrictlyIdeas.no Fact Sheet for Healthcare Providers: BankingDealers.co.za This test is not yet approved or cleared by the [place] FDA and has been authorized for detection and/or diagnosis of SARS-CoV-2 by FDA under an Emergency Use Authorization (EUA).  This EUA will remain in effect (meaning this test can be used) for the duration of the COVID-19 declaration under Section 564(b)(1) of the Act, 21 U.S.C. section 360bbb-3(b)(1), unless the authorization is terminated or revoked sooner. Performed at [facility], [place] [address],  [place], [place] [address]      Time coordinating discharge: 45 minutes  SIGNED:   Jonnie Finner, DO  [facility] 05/13/2019, 3:32 PM Pager   If 7PM-7AM, please contact night-coverage [email or web address] Password [email or web address]

## 2019-05-13 NOTE — Interval H&P Note (Signed)
History and Physical Interval Note:  05/13/2019 9:33 AM  Carolyn Mclaughlin  has presented today for surgery, with the diagnosis of stroke.  The various methods of treatment have been discussed with the patient and family. After consideration of risks, benefits and other options for treatment, the patient has consented to  Procedure(s): TRANSESOPHAGEAL ECHOCARDIOGRAM (TEE) (N/A) as a surgical intervention.  The patient's history has been reviewed, patient examined, no change in status, stable for surgery.  I have reviewed the patient's chart and labs.  Questions were answered to the patient's satisfaction.     Carolyn Mclaughlin

## 2019-05-13 NOTE — TOC Transition Note (Signed)
Transition of Care [facility]) - CM/SW Discharge Note   Patient Details  Name: Carolyn Mclaughlin MRN: [id number] Date of Birth: 04-10-1971  [facility] Care [facility]) CM/SW Contact:  Pollie Friar, RN Phone Number: 05/13/2019, 4:06 PM   Clinical Narrative:    Pt discharging home and will have outpatient ST. Pt asked to attend in the [facility] area. CM placed orders in Epic for [facility] outpatient. Information on the AVS. Pt has transportation home.   Final next level of care: OP Rehab Barriers to Discharge: No Barriers Identified   Patient Goals and CMS Choice   CMS Medicare.gov Compare Post Acute Care list provided to:: Patient Choice offered to / list presented to : Patient  Discharge Placement                       Discharge Plan and Services   Discharge Planning Services: CM Consult                                 Social Determinants of Health (SDOH) Interventions     Readmission Risk Interventions No flowsheet data found.

## 2019-05-13 NOTE — Progress Notes (Signed)
Pt d/c from unit via W/C, left with daughter. D/C education given to daughter and pt. All lines removed.  Carolyn Mclaughlin

## 2019-05-13 NOTE — CV Procedure (Signed)
INDICATIONS: stroke  PROCEDURE:   Informed consent was obtained prior to the procedure. The risks, benefits and alternatives for the procedure were discussed and the patient comprehended these risks.  Risks include, but are not limited to, cough, sore throat, vomiting, nausea, somnolence, esophageal and stomach trauma or perforation, bleeding, low blood pressure, aspiration, pneumonia, infection, trauma to the teeth and death.    After a procedural time-out, the oropharynx was anesthetized with 20% benzocaine spray.   During this procedure the patient was administered propofol per anesthesia (MAC).  The patient's heart rate, blood pressure, and oxygen saturation were monitored continuously during the procedure. The period of conscious sedation was 20 minutes, of which I was present face-to-face 100% of this time.   The transesophageal probe was inserted in the esophagus and stomach without difficulty and multiple views were obtained.  The patient was kept under observation until the patient left the procedure room.  The patient left the procedure room in stable condition.   Agitated microbubble saline contrast was administered.  COMPLICATIONS:    There were no immediate complications.  FINDINGS:  Positive PFO. L to R shunt with color flow Doppler. R to L shunt at rest with agitated saline injection. Valsalva not performed. No LAA thrombus. No hemodynamically significant valvular heart disease.  EF 55-60%.   RECOMMENDATIONS:    OK to return to hospital room when alert.  Time Spent Directly with the Patient:  30 minutes   Carolyn Mclaughlin 05/13/2019, 10:07 AM

## 2019-05-13 NOTE — Anesthesia Postprocedure Evaluation (Signed)
Anesthesia Post Note  Patient: Carolyn Mclaughlin  Procedure(s) Performed: TRANSESOPHAGEAL ECHOCARDIOGRAM (TEE) (N/A ) BUBBLE STUDY     Patient location during evaluation: PACU Anesthesia Type: MAC Level of consciousness: awake and alert Pain management: pain level controlled Vital Signs Assessment: post-procedure vital signs reviewed and stable Respiratory status: spontaneous breathing, nonlabored ventilation, respiratory function stable and patient connected to nasal cannula oxygen Cardiovascular status: stable and blood pressure returned to baseline Postop Assessment: no apparent nausea or vomiting Anesthetic complications: no    Last Vitals:  Vitals:   05/13/19 1013 05/13/19 1020  BP: 115/84 119/82  Pulse: 99 96  Resp: (!) 24 (!) 29  Temp:    SpO2: 100% 100%    Last Pain:  Vitals:   05/13/19 1020  TempSrc:   PainSc: 0-No pain                 Adalbert Alberto S

## 2019-05-13 NOTE — Progress Notes (Signed)
  Echocardiogram Echocardiogram Transesophageal has been performed.  Carolyn Mclaughlin 05/13/2019, 10:21 AM

## 2019-05-13 NOTE — Progress Notes (Signed)
Checked with Cardiology NP, stated to still give Lovenox, Aspirin, and Plavix prior to Loop Recorder placement.  Carolyn Mclaughlin

## 2019-05-13 NOTE — Transfer of Care (Signed)
Immediate Anesthesia Transfer of Care Note  Patient: JACQUELYNE QUARRY  Procedure(s) Performed: TRANSESOPHAGEAL ECHOCARDIOGRAM (TEE) (N/A ) BUBBLE STUDY  Patient Location: Endoscopy Unit  Anesthesia Type:MAC  Level of Consciousness: awake, patient cooperative and responds to stimulation  Airway & Oxygen Therapy: Patient Spontanous Breathing and Patient connected to nasal cannula oxygen  Post-op Assessment: Report given to RN and Post -op Vital signs reviewed and stable  Post vital signs: Reviewed  Last Vitals:  Vitals Value Taken Time  BP    Temp    Pulse 110 05/13/19 1008  Resp 32 05/13/19 1008  SpO2 98 % 05/13/19 1008  Vitals shown include unvalidated device data.  Last Pain:  Vitals:   05/13/19 0844  TempSrc: Temporal  PainSc: 0-No pain         Complications: No apparent anesthesia complications

## 2019-05-13 NOTE — Anesthesia Preprocedure Evaluation (Signed)
Anesthesia Evaluation  Patient identified by MRN, date of birth, ID band Patient awake    Reviewed: Allergy & Precautions, NPO status , Patient's Chart, lab work & pertinent test results  Airway Mallampati: II  TM Distance: >3 FB Neck ROM: Full    Dental no notable dental hx.    Pulmonary neg pulmonary ROS,    Pulmonary exam normal breath sounds clear to auscultation       Cardiovascular negative cardio ROS Normal cardiovascular exam Rhythm:Regular Rate:Normal     Neuro/Psych CVA negative psych ROS   GI/Hepatic negative GI ROS, Neg liver ROS,   Endo/Other  negative endocrine ROS  Renal/GU negative Renal ROS  negative genitourinary   Musculoskeletal negative musculoskeletal ROS (+)   Abdominal   Peds negative pediatric ROS (+)  Hematology negative hematology ROS (+)   Anesthesia Other Findings   Reproductive/Obstetrics negative OB ROS                             Anesthesia Physical Anesthesia Plan  ASA: III  Anesthesia Plan: MAC   Post-op Pain Management:    Induction: Intravenous  PONV Risk Score and Plan: 0  Airway Management Planned: Simple Face Mask  Additional Equipment:   Intra-op Plan:   Post-operative Plan:   Informed Consent: I have reviewed the patients History and Physical, chart, labs and discussed the procedure including the risks, benefits and alternatives for the proposed anesthesia with the patient or authorized representative who has indicated his/her understanding and acceptance.     Dental advisory given  Plan Discussed with: CRNA and Surgeon  Anesthesia Plan Comments:         Anesthesia Quick Evaluation

## 2019-05-13 NOTE — Plan of Care (Signed)
  Problem: Health Behavior/Discharge Planning: Goal: Ability to manage health-related needs will improve 05/13/2019 0056 by Marcos Eke, RN Outcome: Progressing 05/13/2019 0049 by Carin Hock I, RN Outcome: Progressing   Problem: Self-Care: Goal: Ability to participate in self-care as condition permits will improve Outcome: Progressing   Problem: Self-Care: Goal: Ability to communicate needs accurately will improve Outcome: Progressing

## 2019-05-13 NOTE — TOC Initial Note (Signed)
Transition of Care [facility]) - Initial/Assessment Note    Patient Details  Name: Carolyn Mclaughlin MRN: [id number] Date of Birth: 09-23-1971  Transition of Care [facility]) CM/SW Contact:    Pollie Friar, RN Phone Number: 05/13/2019, 4:06 PM  Clinical Narrative:                   Expected Discharge Plan: OP Rehab Barriers to Discharge: No Barriers Identified   Patient Goals and CMS Choice   CMS Medicare.gov Compare Post Acute Care list provided to:: Patient Choice offered to / list presented to : Patient  Expected Discharge Plan and Services Expected Discharge Plan: OP Rehab   Discharge Planning Services: CM Consult     Expected Discharge Date: 05/13/19                                    Prior Living Arrangements/Services   Lives with:: Self(daughter can provide some supervision) Patient language and need for interpreter reviewed:: Yes(no needs) Do you feel safe going back to the place where you live?: Yes      Need for Family Participation in Patient Care: Yes (Comment)(intermittent supervision) Care giver support system in place?: Yes (comment)(daughter can provide needed supervision)   Criminal Activity/Legal Involvement Pertinent to Current Situation/Hospitalization: No - Comment as needed  Activities of Daily Living Home Assistive Devices/Equipment: None ADL Screening (condition at time of admission) Patient's cognitive ability adequate to safely complete daily activities?: Yes Is the patient deaf or have difficulty hearing?: No Does the patient have difficulty seeing, even when wearing glasses/contacts?: No Does the patient have difficulty concentrating, remembering, or making decisions?: Yes Patient able to express need for assistance with ADLs?: Yes Does the patient have difficulty dressing or bathing?: No Independently performs ADLs?: Yes (appropriate for developmental age) Does the patient have difficulty walking or climbing stairs?: No Weakness of  Legs: None Weakness of Arms/Hands: None  Permission Sought/Granted                  Emotional Assessment Appearance:: Appears younger than stated age Attitude/Demeanor/Rapport: Engaged Affect (typically observed): Accepting, Calm Orientation: : Oriented to Self, Oriented to Place, Oriented to  Time, Oriented to Situation   Psych Involvement: No (comment)  Admission diagnosis:  Aphasia [R47.01] Stroke [facility]) [I63.9] Patient Active Problem List   Diagnosis Date Noted  . Elevated liver enzymes 05/13/2019  . Memory problem 05/13/2019  . PFO (patent foramen ovale) 05/13/2019  . Elevated antinuclear antibody (ANA) level 05/13/2019  . Acute ischemic stroke ([place]) 05/10/2019  . Stroke ([place]) 05/10/2019  . Incarcerated epigastric hernia 04/26/2019  . Umbilical hernia 23/53/6144   PCP:  Pllc, [place]:   [facility] [place], [place] AT [place] [address] [place] [address] Phone: [phone] Fax: [phone]     Social Determinants of Health (SDOH) Interventions    Readmission Risk Interventions No flowsheet data found.

## 2019-05-13 NOTE — Plan of Care (Signed)
  Problem: Education: Goal: Knowledge of patient specific risk factors addressed and post discharge goals established will improve Outcome: Progressing   Problem: Education: Goal: Knowledge of patient specific risk factors addressed and post discharge goals established will improve Outcome: Progressing

## 2019-05-15 ENCOUNTER — Encounter (HOSPITAL_COMMUNITY): Payer: Self-pay | Admitting: Internal Medicine

## 2019-05-16 ENCOUNTER — Encounter (HOSPITAL_COMMUNITY): Payer: Self-pay | Admitting: Cardiology

## 2019-05-19 ENCOUNTER — Other Ambulatory Visit: Payer: Self-pay

## 2019-05-19 ENCOUNTER — Encounter (HOSPITAL_COMMUNITY): Payer: Self-pay | Admitting: General Surgery

## 2019-05-19 NOTE — Patient Outreach (Signed)
[facility]) Care Management  05/19/2019  Carolyn Mclaughlin 15-Jun-1971 [id number]  EMMI: stroke red alert Referral date: 05/19/19 Referral reason: questions/ problems with medications Insurance:  [employer]/ blue shield Day # 3  Telephone call to patient.regarding EMMI stroke red alert. HIPAA verified with patient. RNCM introduced herself to patient and explained reason for call. Patient states she is not having any problems with her medications. Patient states she is taking her medications as prescribed. Patient states she had a follow up with her primary MD on yesterday. Patient state she is scheduled for outpatient therapy follow up and a follow up with her cardiologist. Patient states she has transportation to her appointments.  Patient denies any new symptoms.  RNCM reviewed signs/ symptoms of stroke with patient. Advised patient that 911 should be called for stroke like symptoms.   RNCM discussed COVID 19 precautions and symptoms.  Advised patient to contact her doctor for minor symptoms. If symptoms more severe call 911.  Patient verbalized understanding.   PLAN: RNCM will close case due to patient being assessed and having no further needs.   Quinn Plowman RN,BSN,CCM [facility] Telephonic  [phone]

## 2019-05-20 ENCOUNTER — Encounter (HOSPITAL_COMMUNITY): Payer: Self-pay | Admitting: General Surgery

## 2019-05-25 ENCOUNTER — Other Ambulatory Visit: Payer: Self-pay

## 2019-05-25 ENCOUNTER — Ambulatory Visit (HOSPITAL_COMMUNITY): Payer: BC Managed Care – PPO | Attending: Internal Medicine | Admitting: Speech Pathology

## 2019-05-25 ENCOUNTER — Encounter (HOSPITAL_COMMUNITY): Payer: Self-pay | Admitting: Speech Pathology

## 2019-05-25 ENCOUNTER — Telehealth: Payer: Self-pay

## 2019-05-25 DIAGNOSIS — R41841 Cognitive communication deficit: Secondary | ICD-10-CM | POA: Insufficient documentation

## 2019-05-25 NOTE — Telephone Encounter (Signed)
Appointment 05-26-2019       COVID-19 Pre-Screening Questions:  . In the past 7 to 10 days have you had a cough,  shortness of breath, headache, congestion, fever (100 or greater) body aches, chills, sore throat, or sudden loss of taste or sense of smell? No . Have you been around anyone with known Covid 19. No . Have you been around anyone who is awaiting Covid 19 test results in the past 7 to 10 days? No . Have you been around anyone who has been exposed to Covid 19, or has mentioned symptoms of Covid 19 within the past 7 to 10 days? No  If you have any concerns/questions about symptoms patients report during screening (either on the phone or at threshold). Contact the provider seeing the patient or DOD for further guidance.  If neither are available contact a member of the leadership team.  Pt answered No to all Covid-19 prescreening questions. I asked her to wear a mask if she had one. I also asked the pt to come alone because we are reducing the amount of people coming into the office. Pt mentioned her daughter will be bringing her to the appointment and will need to be with her for the appointment because she takes care of her. I told the pt unless her daughter is pushing a wheelchair she can not come to the appointment. I told her if she needs the nurse or Doctor to call her daughter after the appointment we can do that. I told her to remind the nurse to call her daughter about what was said in the appointment. Pt verbalized understanding.

## 2019-05-25 NOTE — Therapy (Signed)
[facility] [address] [place], [place], [address] Phone: [phone]   Fax:  [phone]  Speech Language Pathology Evaluation  Patient Details  Name: Carolyn Mclaughlin MRN: [id number] Date of Birth: [birth year] Referring Provider (SLP): Margie Egeyrone Kyle, MD   Encounter Date: 05/25/2019  End of Session - 05/25/19 1229    Visit Number  1    Number of Visits  9    Date for SLP Re-Evaluation  06/30/19    Authorization Type  [employer] employee    SLP Start Time  [phone]    SLP Stop Time   1000    SLP Time Calculation (min)  55 min    Activity Tolerance  Patient tolerated treatment well       Past Medical History:  Diagnosis Date  . Headache   . Incarcerated epigastric hernia 04/26/2019  . Umbilical hernia 04/26/2019    Past Surgical History:  Procedure Laterality Date  . BUBBLE STUDY  05/13/2019   Procedure: BUBBLE STUDY;  Surgeon: Parke PoissonAcharya, Gayatri A, MD;  [facility];  Service: Cardiology;;  . LOOP RECORDER INSERTION Left 05/13/2019   Procedure: LOOP RECORDER INSERTION;  Surgeon: Regan Lemmingamnitz, Will Martin, MD;  [facility];  Service: Cardiovascular;  Laterality: Left;  . NO PAST SURGERIES    . TEE WITHOUT CARDIOVERSION N/A 05/13/2019   Procedure: TRANSESOPHAGEAL ECHOCARDIOGRAM (TEE);  Surgeon: Parke PoissonAcharya, Gayatri A, MD;  [facility];  Service: Cardiology;  Laterality: N/A;  . VENTRAL HERNIA REPAIR N/A 04/26/2019   Procedure: LAPRASCOPIC REPAIR OF INCARCERATED EPIGASTRIC HERNIA WITH MESH;  Surgeon: Claud KelpIngram, Haywood, MD;  Location: [facility];  Service: General;  Laterality: N/A;    There were no vitals filed for this visit.  Subjective Assessment - 05/25/19 1216    Subjective  "I am having trouble remembering things."    Special Tests  MoCA    Currently in Pain?  No/denies         SLP Evaluation [facility] - 05/25/19 1216      SLP Visit Information   SLP Received On  05/25/19    Referring Provider (SLP)   Margie Egeyrone Kyle, MD    Onset Date  05/10/2019    Medical Diagnosis  thalamic infarct      General Information   HPI  Carolyn Mclaughlin is a 48 y.o. female with history of hernia surgery 04/26/2019 presenting with memory dysfunction since Thursday 05/05/2019.  Pt  [facility] and found to have L thalamic infarct most likely due to small vessel disease, however cannot rule out embolic source given recent surgery/post op stat. CT head hypoattenuation left basal ganglia and thalamus. MRI  2 cm left thalamic infarct. Pt had SLE during acute stay with recommendation for OP SLP due to cognitive linguistic deficits noted during evaluation. Pt is referred to OP SLP by Dr. Margie Egeyrone Kyle. She will follow up with Shirley FriarJessica Vanschaik in mid July with [facility]. 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    Behavioral/Cognition  Alert and cooperative    Mobility Status  ambulatory      Balance Screen   Has the patient fallen in the past 6 months  No    Has the patient had a decrease in activity level because of a fear of falling?   No    Is the patient reluctant to leave their home because of a fear of falling?  No      Prior Functional Status   Cognitive/Linguistic Baseline  Within functional limits    Type of Home  Apartment     Lives With  Alone    Available Support  Family    Education  2 years college.     Vocation  Full time employment      Pain Assessment   Pain Assessment  No/denies pain      Cognition   Overall Cognitive Status  Impaired/Different from baseline    Area of Impairment  Attention;Memory    Current Attention Level  Sustained    Memory  Decreased short-term memory    Memory Comments  0/5 recall after 5 minutes; 3/5 immediate    Attention  Sustained    Sustained Attention  Impaired    Sustained Attention Impairment  Verbal complex;Functional complex    Memory  Impaired    Memory Impairment  Storage  deficit;Retrieval deficit;Decreased short term memory    Decreased Short Term Memory  Verbal basic    Awareness  Impaired    Awareness Impairment  Emergent impairment    Problem Solving  Appears intact      Auditory Comprehension   Overall Auditory Comprehension  Appears within functional limits for tasks assessed    Yes/No Questions  Within Functional Limits    Commands  Within Functional Limits    Complex Commands  75-100% accurate    Conversation  Complex    Interfering Components  Attention;Working Engineer, materialsmemory      Visual Recognition/Discrimination   Discrimination  Within [employer] Limits      Reading Comprehension   Reading Status  Within funtional limits      Expression   Primary Mode of Expression  Verbal      Verbal Expression   Overall Verbal Expression  Impaired    Initiation  No impairment    Automatic Speech  Name;Social Response    Level of Generative/Spontaneous Verbalization  Conversation    Repetition  No impairment    Naming  Impairment    Responsive  51-75% accurate    Confrontation  50-74% accurate    Convergent  Not tested    Divergent  50-74% accurate    Pragmatics  No impairment    Interfering Components  Attention    Effective Techniques  Phonemic cues    Non-Verbal Means of Communication  Not applicable      Written Expression   Dominant Hand  Right    Written Expression  Not tested      Oral Motor/Sensory Function   Overall Oral Motor/Sensory Function  Appears within functional limits for tasks assessed      Motor Speech   Overall Motor Speech  Appears within functional limits for tasks assessed    Respiration  Within functional limits    Phonation  Normal    Resonance  Within functional limits    Articulation  Within functional limitis    Intelligibility  Intelligible    Motor Planning  Witnin functional limits    Motor Speech Errors  Not applicable    Phonation  WFL      Standardized Assessments   Standardized Assessments   Montreal  Cognitive Assessment (MOCA); 20/30       SLP Education - 05/25/19 1228    Education Details  Provided Pt with folder with memory strategies and daily memory task; plan for treatment    Person(s) Educated  Patient    Methods  Explanation;Handout    Comprehension  Verbalized understanding       SLP Short Term Goals - 05/25/19 1403      SLP SHORT TERM GOAL #1   Title  Pt will implement memory strategies in functional therapy activities with 90% acc with min cues    Baseline  0/5 recall    Time  4    Period  Weeks    Status  New    Target Date  07/07/19      SLP SHORT TERM GOAL #2   Title  Pt will complete selective and alternating attention tasks (moderately complex) with 90% acc and mi/mod cues.    Baseline  60%    Time  4    Period  Weeks    Status  New    Target Date  07/07/19      SLP SHORT TERM GOAL #3   Title  Pt will implement word-finding strategies with 90% accuracy when unable to verbalize desired word in conversation/functional tasks with mi/mod assist.    Baseline  BNT short form 8/15    Time  4    Period  Weeks    Status  New    Target Date  07/07/19      SLP SHORT TERM GOAL #4   Title  Pt will increase divergent naming to 10+ items per concrete category with min cues.    Baseline  6    Time  4    Period  Weeks    Status  New    Target Date  07/07/19      SLP SHORT TERM GOAL #5   Title  Pt will complete high-level word retrieval tasks with 95% acc and min assist    Baseline  70%    Time  4    Period  Weeks    Status  New    Target Date  07/07/19       SLP Long Term Goals - 05/25/19 1409      SLP LONG TERM GOAL #1   Title  Same as short term       Plan - 05/25/19 1230    Clinical Impression Statement  Pt presents with moderate cognitive linguistic deficits characterized by deficits in attention, memory (storage and retrieval), and dysnomia. Pt will benefit from skilled SLP in order to address the above impairments, maximize independence, and  decrease burden of care. 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 SLP strongly encouraged Pt to attend at least a few SLP sessions prior to returning to work so that she is set up for success in her work environment.     Speech Therapy Frequency  2x / week    Duration  4 weeks    Treatment/Interventions  Compensatory strategies;Cueing hierarchy;Functional tasks;Patient/family education;Cognitive reorganization;Compensatory techniques;Internal/external aids;SLP instruction and feedback    Potential to Achieve Goals  Good    SLP Home Exercise Plan  Pt will complete HEP as assigned to facilitate carryover of treatment techniques in home environment    Consulted and Agree with Plan of Care  Patient       Patient will benefit from skilled therapeutic intervention in order to improve the following deficits and impairments:   1. Cognitive communication deficit       Problem List Patient Active Problem List   Diagnosis Date Noted  . Elevated liver enzymes 05/13/2019  . Memory problem 05/13/2019  . PFO (patent foramen ovale) 05/13/2019  . Elevated antinuclear  antibody (ANA) level 05/13/2019  . Acute ischemic stroke ([place]) 05/10/2019  . Stroke ([place]) 05/10/2019  . Incarcerated epigastric hernia 04/26/2019  . Umbilical hernia 16/09/9603   Thank you,  Genene Churn, [place]  [facility] 05/25/2019, 2:10 PM  [place] [address] [place], [place], [address] Phone: [phone]   Fax:  [phone]  Name: Carolyn Mclaughlin MRN: [id number] Date of Birth: [birth year]

## 2019-05-26 ENCOUNTER — Ambulatory Visit (INDEPENDENT_AMBULATORY_CARE_PROVIDER_SITE_OTHER): Payer: BC Managed Care – PPO | Admitting: *Deleted

## 2019-05-26 DIAGNOSIS — I639 Cerebral infarction, unspecified: Secondary | ICD-10-CM

## 2019-05-26 LAB — CUP PACEART INCLINIC DEVICE CHECK
Date Time Interrogation Session: 20200625103712
Implantable Pulse Generator Implant Date: 20200612

## 2019-05-26 NOTE — Progress Notes (Signed)
ILR wound check in clinic. Steri strips removed. Wound well healed. R waves 0.41 mv. Home monitor transmitting nightly. No episodes. Questions answered.

## 2019-05-31 ENCOUNTER — Ambulatory Visit (HOSPITAL_COMMUNITY): Payer: BC Managed Care – PPO | Admitting: Speech Pathology

## 2019-05-31 ENCOUNTER — Other Ambulatory Visit: Payer: Self-pay

## 2019-05-31 ENCOUNTER — Encounter (HOSPITAL_COMMUNITY): Payer: Self-pay | Admitting: Speech Pathology

## 2019-05-31 DIAGNOSIS — R41841 Cognitive communication deficit: Secondary | ICD-10-CM | POA: Diagnosis not present

## 2019-05-31 NOTE — Therapy (Signed)
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 Headache   . Incarcerated epigastric hernia 04/26/2019  . Umbilical hernia 04/26/2019    Past Surgical History:  Procedure Laterality Date  . BUBBLE STUDY  05/13/2019   Procedure: BUBBLE STUDY;  Surgeon: Parke PoissonAcharya, Gayatri A, MD;  [facility];  Service: Cardiology;;  . LOOP RECORDER INSERTION Left 05/13/2019   Procedure: LOOP RECORDER INSERTION;  Surgeon: Regan Lemmingamnitz, Will Martin, MD;  [facility];  Service: Cardiovascular;  Laterality: Left;  . NO PAST SURGERIES    . TEE WITHOUT CARDIOVERSION N/A 05/13/2019   Procedure: TRANSESOPHAGEAL ECHOCARDIOGRAM (TEE);  Surgeon: Parke PoissonAcharya, Gayatri A, MD;  [facility];  Service: Cardiology;  Laterality: N/A;  . VENTRAL HERNIA REPAIR N/A 04/26/2019   Procedure: LAPRASCOPIC REPAIR OF INCARCERATED EPIGASTRIC HERNIA WITH MESH;  Surgeon: Claud KelpIngram, Haywood, MD;  Location: [facility];  Service: General;  Laterality: N/A;    There were no vitals filed for this visit.  Subjective Assessment - 05/31/19 1015    Subjective  "I couldn't tell you what a vegetable is."    Currently in Pain?  No/denies        ADULT SLP TREATMENT - 05/31/19 1053      General Information   Behavior/Cognition  Alert;Pleasant mood;Cooperative    Patient Positioning  Upright in  chair    Oral care provided  N/A    HPI  Carolyn Mclaughlin is a 48 y.o. female with history of hernia surgery 04/26/2019 presenting with memory dysfunction since Thursday 05/05/2019.  Pt  [facility] and found to have L thalamic infarct most likely due to small vessel disease, however cannot rule out embolic source given recent surgery/post op stat. CT head hypoattenuation left basal ganglia and thalamus. MRI  2 cm left thalamic infarct. Pt had SLE during acute stay with recommendation for OP SLP due to cognitive linguistic deficits noted during evaluation. Pt is referred to OP SLP by Dr. Margie Egeyrone Kyle. She will follow up with Shirley FriarJessica Vanschaik in mid July with [facility]. 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       Treatment Provided   Treatment provided  Cognitive-Linquistic      Pain Assessment   Pain Assessment  No/denies pain      Cognitive-Linquistic Treatment   Treatment focused on  Aphasia;Cognition;Patient/family/caregiver education    Skilled Treatment SLP provided skilled interventions targeting memory, attention, and word finding activities while addressing the following goals: Pt will implement memory strategies in functional therapy activities with 90% acc with min cues. 5/12 for 4-item recall with max cues for strategies. Pt will complete selective and alternating attention tasks (moderately  complex) with 90% acc and mi/mod cues. Introduced attention strategies today. Pt will implement word-finding strategies with 90% accuracy when unable to verbalize desired word in conversation/functional tasks with mi/mod assist. Introduced strategies, provided handout, and implemented with sample words. Pt will increase divergent naming to 10+ items per concrete category with min cues. 6/10 and gave for homework. Pt will complete high-level word retrieval tasks with 95% acc and min assist. N/A, provided convergent naming  task for homework.     Assessment / Recommendations / Plan   Plan  Continue with current plan of care      Progression Toward Goals   Progression toward goals  Progressing toward goals       SLP Education - 05/31/19 1055    Education Details  Provided word finding strategies, reviewed goals for therapy    Person(s) Educated  Patient    Methods  Explanation;Handout    Comprehension  Verbalized understanding       SLP Short Term Goals - 05/31/19 1057      SLP SHORT TERM GOAL #1   Title  Pt will implement memory strategies in functional therapy activities with 90% acc with min cues    Baseline  0/5 recall    Time  4    Period  Weeks    Status  On-going    Target Date  07/07/19      SLP SHORT TERM GOAL #2   Title  Pt will complete selective and alternating attention tasks (moderately complex) with 90% acc and mi/mod cues.    Baseline  60%    Time  4    Period  Weeks    Status  On-going    Target Date  07/07/19      SLP SHORT TERM GOAL #3   Title  Pt will implement word-finding strategies with 90% accuracy when unable to verbalize desired word in conversation/functional tasks with mi/mod assist.    Baseline  BNT short form 8/15    Time  4    Period  Weeks    Status  On-going    Target Date  07/07/19      SLP SHORT TERM GOAL #4   Title  Pt will increase divergent naming to 10+ items per concrete category with min cues.    Baseline  6    Time  4    Period  Weeks    Status  On-going    Target Date  07/07/19      SLP SHORT TERM GOAL #5   Title  Pt will complete high-level word retrieval tasks with 95% acc and min assist    Baseline  70%    Time  4    Period  Weeks    Status  On-going    Target Date  07/07/19       SLP Long Term Goals - 05/31/19 1058      SLP LONG TERM GOAL #1   Title  Same as short term       Plan - 05/31/19 1057    Clinical Impression Statement  Pt presents with moderate cognitive linguistic deficits characterized by deficits in  attention, memory (storage and retrieval), and dysnomia. The evaluation was reviewed and Pt was initially surprised to score 18/30 with normal being 26/30. Problem areas were reviewed and written down for Pt. SLP also discussed insight/awareness into deficits. Pt appeared to appreciate memory/recall deficits following structured memory task to recall 4 items. Pt provided cues for implementation of strategies  and she still was unable to recall any of the 4 words independently. Plan to do 10-item memory task next session to further assess memory. She was given daily convergent and divergent naming activities for homework.     Speech Therapy Frequency  2x / week    Duration  4 weeks    Treatment/Interventions  Compensatory strategies;Cueing hierarchy;Functional tasks;Patient/family education;Cognitive reorganization;Compensatory techniques;Internal/external aids;SLP instruction and feedback    Potential to Achieve Goals  Good    SLP Home Exercise Plan  Pt will complete HEP as assigned to facilitate carryover of treatment techniques in home environment    Consulted and Agree with Plan of Care  Patient       Patient will benefit from skilled therapeutic intervention in order to improve the following deficits and impairments:   1. Cognitive communication deficit       Problem List Patient Active Problem List   Diagnosis Date Noted  . Elevated liver enzymes 05/13/2019  . Memory problem 05/13/2019  . PFO (patent foramen ovale) 05/13/2019  . Elevated antinuclear antibody (ANA) level 05/13/2019  . Acute ischemic stroke ([place]) 05/10/2019  . Stroke ([place]) 05/10/2019  . Incarcerated epigastric hernia 04/26/2019  . Umbilical hernia 62/69/4854   Thank you,  Genene Churn, [place]  [facility] 05/31/2019, 11:42 AM  [place] [address] [place], [place], [address] Phone: [phone]   Fax:  [phone]   Name: SHEVY YANEY MRN: [id number] Date of Birth: 06-18-1971

## 2019-06-06 ENCOUNTER — Other Ambulatory Visit: Payer: Self-pay

## 2019-06-06 ENCOUNTER — Encounter (HOSPITAL_COMMUNITY): Payer: Self-pay | Admitting: Speech Pathology

## 2019-06-06 ENCOUNTER — Ambulatory Visit (HOSPITAL_COMMUNITY): Payer: BC Managed Care – PPO | Attending: Internal Medicine | Admitting: Speech Pathology

## 2019-06-06 DIAGNOSIS — R41841 Cognitive communication deficit: Secondary | ICD-10-CM | POA: Insufficient documentation

## 2019-06-06 NOTE — Therapy (Signed)
[place], [place], [address] Phone: [phone]   Fax:  [phone]  Speech Language Pathology Treatment  Patient Details  Name: Carolyn Mclaughlin MRN: [id number] Date of Birth: 06/03/1971 Referring Provider (SLP): Cherylann Ratel, MD   Encounter Date: 06/06/2019  End of Session - 06/06/19 0942    Visit Number  3    Number of Visits  9    Date for SLP Re-Evaluation  06/30/19    Authorization Type  [employer] employee    SLP Start Time  [phone]    SLP Stop Time   3710    SLP Time Calculation (min)  49 min    Activity Tolerance  Patient tolerated treatment well       Past Medical History:  Diagnosis Date  . Headache   . Incarcerated epigastric hernia 04/26/2019  . Umbilical hernia 05/26/9484    Past Surgical History:  Procedure Laterality Date  . BUBBLE STUDY  05/13/2019   Procedure: BUBBLE STUDY;  Surgeon: Elouise Munroe, MD;  Location: [place];  Service: Cardiology;;  . LOOP RECORDER INSERTION Left 05/13/2019   Procedure: LOOP RECORDER INSERTION;  Surgeon: Constance Haw, MD;  Location: [place] [facility];  Service: Cardiovascular;  Laterality: Left;  . NO PAST SURGERIES    . TEE WITHOUT CARDIOVERSION N/A 05/13/2019   Procedure: TRANSESOPHAGEAL ECHOCARDIOGRAM (TEE);  Surgeon: Elouise Munroe, MD;  Location: [place];  Service: Cardiology;  Laterality: N/A;  . VENTRAL HERNIA REPAIR N/A 04/26/2019   Procedure: LAPRASCOPIC REPAIR OF INCARCERATED EPIGASTRIC HERNIA WITH MESH;  Surgeon: Fanny Skates, MD;  [facility];  Service: General;  Laterality: N/A;    There were no vitals filed for this visit.  Subjective Assessment - 06/06/19 0914    Subjective  "I feel like I am getting the run around about getting released for work."    Currently in Pain?  No/denies        ADULT SLP TREATMENT - 06/06/19 0001      General Information   Behavior/Cognition  Alert;Pleasant mood;Cooperative     Patient Positioning  Upright in chair    Oral care provided  N/A    HPI  [name] is a 48 y.o. female with history of hernia surgery 04/26/2019 presenting with memory dysfunction since Thursday 05/05/2019.  Pt  presented to [facility] 05/10/19 and found to have L thalamic infarct most likely due to small vessel disease, however cannot rule out embolic source given recent surgery/post op stat. CT head hypoattenuation left basal ganglia and thalamus. MRI  2 cm left thalamic infarct. Pt had SLE during acute stay with recommendation for OP SLP due to cognitive linguistic deficits noted during evaluation. Pt is referred to OP SLP by Dr. Cherylann Ratel. She will follow up with Sherald Barge in mid July with [facility]. 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       Treatment Provided   Treatment provided  Cognitive-Linquistic      Pain Assessment   Pain Assessment  No/denies pain      Cognitive-Linquistic Treatment   Treatment focused on  Aphasia;Cognition;Patient/family/caregiver education    Skilled Treatment  SLP provided skilled interventions targeting memory, attention, and word finding activities while addressing the following goals: Pt will implement memory strategies in functional therapy activities with 90% acc with min cues. 10-item recall task with association cues over each trial: 2/10, 3/10,  5/10, 7/10, 10/10; straight recall without cues after working with words for 15 minutes was only 2/10. Pt will complete selective and alternating attention tasks (moderately complex) with 90% acc and mi/mod cues. Will target formally next session, impaired today during memory task. Pt will implement word-finding strategies with 90% accuracy when unable to verbalize desired word in conversation/functional tasks with mi/mod assist. N/A. Pt will increase divergent naming to 10+ items per concrete category with min cues. N/A. Pt will complete  high-level word retrieval tasks with 95% acc and min assist. N/A      Assessment / Recommendations / Plan   Plan  Continue with current plan of care      Progression Toward Goals   Progression toward goals  Progressing toward goals         SLP Short Term Goals - 06/06/19 0946      SLP SHORT TERM GOAL #1   Title  Pt will implement memory strategies in functional therapy activities with 90% acc with min cues    Baseline  0/5 recall    Time  4    Period  Weeks    Status  On-going    Target Date  07/07/19      SLP SHORT TERM GOAL #2   Title  Pt will complete selective and alternating attention tasks (moderately complex) with 90% acc and mi/mod cues.    Baseline  60%    Time  4    Period  Weeks    Status  On-going    Target Date  07/07/19      SLP SHORT TERM GOAL #3   Title  Pt will implement word-finding strategies with 90% accuracy when unable to verbalize desired word in conversation/functional tasks with mi/mod assist.    Baseline  BNT short form 8/15    Time  4    Period  Weeks    Status  On-going    Target Date  07/07/19      SLP SHORT TERM GOAL #4   Title  Pt will increase divergent naming to 10+ items per concrete category with min cues.    Baseline  6    Time  4    Period  Weeks    Status  On-going    Target Date  07/07/19      SLP SHORT TERM GOAL #5   Title  Pt will complete high-level word retrieval tasks with 95% acc and min assist    Baseline  70%    Time  4    Period  Weeks    Status  On-going    Target Date  07/07/19       SLP Long Term Goals - 06/06/19 0947      SLP LONG TERM GOAL #1   Title  Same as short term       Plan - 06/06/19 0945    Clinical Impression Statement  Pt presents with moderate cognitive linguistic deficits characterized by deficits in attention, memory (storage and retrieval), and dysnomia. Pt forgot to bring her folder today so homework could not be reviewed. Pt was unable to answer some questions related to appointments  with other medical professionals, but stated that she had it written down in her notebook at home. She was reminded that she should bring her notebook everywhere with her at this time. The majority of the session was spent on the 10-item recall task of which she performed poorly for the first three repetitions despite being given  association cues. Her performance improved once she was allowed to write down the words and was cued to use association and visualization strategies. She became distressed at times when she seemed to recognize that she was unable to recall words with consistency. Insight into memory deficits continues to be impaired, however she did seesm to demonstrate emergent awareness today. She was given a visual memory task for homework today. Continue plan of care and target attention task tomorrow and also visual memory.    Speech Therapy Frequency  2x / week    Duration  4 weeks    Treatment/Interventions  Compensatory strategies;Cueing hierarchy;Functional tasks;Patient/family education;Cognitive reorganization;Compensatory techniques;Internal/external aids;SLP instruction and feedback    Potential to Achieve Goals  Good    SLP Home Exercise Plan  Pt will complete HEP as assigned to facilitate carryover of treatment techniques in home environment    Consulted and Agree with Plan of Care  Patient       Patient will benefit from skilled therapeutic intervention in order to improve the following deficits and impairments:   1. Cognitive communication deficit       Problem List Patient Active Problem List   Diagnosis Date Noted  . Elevated liver enzymes 05/13/2019  . Memory problem 05/13/2019  . PFO (patent foramen ovale) 05/13/2019  . Elevated antinuclear antibody (ANA) level 05/13/2019  . Acute ischemic stroke (HCC) 05/10/2019  . Stroke (HCC) 05/10/2019  . Incarcerated epigastric hernia 04/26/2019  . Umbilical hernia 04/26/2019   Thank you,  Havery MorosDabney Refugio Vandevoorde,  CCC-SLP [phone]   [facility],Marnie Fazzino 06/06/2019, 10:07 AM  [facility] [address] [place], [place], [address] Phone: [phone]   Fax:  [phone]   Name: Roselie AwkwardCynthia M Feimster MRN: [id number] Date of Birth: Jul 07, 1971

## 2019-06-07 ENCOUNTER — Ambulatory Visit (HOSPITAL_COMMUNITY): Payer: BC Managed Care – PPO | Admitting: Speech Pathology

## 2019-06-07 ENCOUNTER — Encounter (HOSPITAL_COMMUNITY): Payer: Self-pay | Admitting: Speech Pathology

## 2019-06-07 DIAGNOSIS — R41841 Cognitive communication deficit: Secondary | ICD-10-CM

## 2019-06-07 NOTE — Therapy (Signed)
[facility] [address] [place], [place], [address] Phone: [phone]   Fax:  [phone]  Speech Language Pathology Treatment  Patient Details  Name: Carolyn Mclaughlin MRN: [id number] Date of Birth: Mar 31, 1971 Referring Provider (SLP): Margie Egeyrone Kyle, MD   Encounter Date: 06/07/2019    Past Medical History:  Diagnosis Date  . Headache   . Incarcerated epigastric hernia 04/26/2019  . Umbilical hernia 04/26/2019    Past Surgical History:  Procedure Laterality Date  . BUBBLE STUDY  05/13/2019   Procedure: BUBBLE STUDY;  Surgeon: Parke PoissonAcharya, Gayatri A, MD;  [facility];  Service: Cardiology;;  . LOOP RECORDER INSERTION Left 05/13/2019   Procedure: LOOP RECORDER INSERTION;  Surgeon: Regan Lemmingamnitz, Will Martin, MD;  [facility];  Service: Cardiovascular;  Laterality: Left;  . NO PAST SURGERIES    . TEE WITHOUT CARDIOVERSION N/A 05/13/2019   Procedure: TRANSESOPHAGEAL ECHOCARDIOGRAM (TEE);  Surgeon: Parke PoissonAcharya, Gayatri A, MD;  [facility];  Service: Cardiology;  Laterality: N/A;  . VENTRAL HERNIA REPAIR N/A 04/26/2019   Procedure: LAPRASCOPIC REPAIR OF INCARCERATED EPIGASTRIC HERNIA WITH MESH;  Surgeon: Claud KelpIngram, Haywood, MD;  [facility];  Service: General;  Laterality: N/A;    There were no vitals filed for this visit.  Subjective Assessment - 06/07/19 1129    Subjective  "I brought my notebook."    Currently in Pain?  No/denies       ADULT SLP TREATMENT - 06/07/19 0001      General Information   Behavior/Cognition  Alert;Pleasant mood;Cooperative    Patient Positioning  Upright in chair    Oral care provided  N/A    HPI  Carolyn Mclaughlin is a 48 y.o. female with history of hernia surgery 04/26/2019 presenting with memory dysfunction since Thursday 05/05/2019.  Pt  [facility] and found to have L thalamic infarct most likely due to small vessel disease, however cannot rule out embolic  source given recent surgery/post op stat. CT head hypoattenuation left basal ganglia and thalamus. MRI  2 cm left thalamic infarct. Pt had SLE during acute stay with recommendation for OP SLP due to cognitive linguistic deficits noted during evaluation. Pt is referred to OP SLP by Dr. Margie Egeyrone Kyle. She will follow up with Shirley FriarJessica Vanschaik [facility]. 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       Treatment Provided   Treatment provided  Cognitive-Linquistic      Pain Assessment   Pain Assessment  No/denies pain      Cognitive-Linquistic Treatment   Treatment focused on  Aphasia;Cognition;Patient/family/caregiver education    Skilled Treatment   SLP provided skilled interventions targeting memory, attention, and word finding activities while addressing the following goals: Pt will implement memory strategies in functional therapy activities with 90% acc with min cues. She completed recall with a distractor task without use of cues with 40%; performance improved with use of written cues to 100%.  Pt will complete selective and alternating attention tasks (moderately complex) with 90% acc and mi/mod cues. Pt completed attention exercise with 100% acc. Pt will implement word-finding strategies with 90% accuracy when unable to verbalize desired word in conversation/functional tasks with mi/mod assist. N/A. Pt will increase divergent naming to 10+ items per concrete category with min cues. N/A. Pt will complete high-level word retrieval tasks with 95% acc and min assist. N/A  Assessment / Recommendations / Plan   Plan  Continue with current plan of care      Progression Toward Goals   Progression toward goals  Progressing toward goals         SLP Short Term Goals - 06/07/19 1143      SLP SHORT TERM GOAL #1   Title  Pt will implement memory strategies in functional therapy activities with 90% acc with min cues     Baseline  0/5 recall    Time  4    Period  Weeks    Status  On-going    Target Date  07/07/19      SLP SHORT TERM GOAL #2   Title  Pt will complete selective and alternating attention tasks (moderately complex) with 90% acc and mi/mod cues.    Baseline  60%    Time  4    Period  Weeks    Status  On-going    Target Date  07/07/19      SLP SHORT TERM GOAL #3   Title  Pt will implement word-finding strategies with 90% accuracy when unable to verbalize desired word in conversation/functional tasks with mi/mod assist.    Baseline  BNT short form 8/15    Time  4    Period  Weeks    Status  On-going    Target Date  07/07/19      SLP SHORT TERM GOAL #4   Title  Pt will increase divergent naming to 10+ items per concrete category with min cues.    Baseline  6    Time  4    Period  Weeks    Status  On-going    Target Date  07/07/19      SLP SHORT TERM GOAL #5   Title  Pt will complete high-level word retrieval tasks with 95% acc and min assist    Baseline  70%    Time  4    Period  Weeks    Status  On-going    Target Date  07/07/19       SLP Long Term Goals - 06/07/19 1146      SLP LONG TERM GOAL #1   Title  Same as short term       Plan - 06/07/19 1151    Clinical Impression Statement  Pt presents with moderate cognitive linguistic deficits characterized by deficits in attention, memory (storage and retrieval), and dysnomia. Pt and SLP worked together to develop an outline of a typical day for her at work. She was asked to identify potential barriers to performance in regards to memory and was able to identify situations with mild/mod cues. She indicates that she plans to write everything down as she typically did and does not anticipate problems. SLP suggested that she continue to use her notebook and sticky notes, but to immediately record the time, date, and person making the request if applicable. Continue plan of care and next session, have Pt complete a complex attention  task with interuptions for completion.    Speech Therapy Frequency  2x / week    Duration  4 weeks    Treatment/Interventions  Compensatory strategies;Cueing hierarchy;Functional tasks;Patient/family education;Cognitive reorganization;Compensatory techniques;Internal/external aids;SLP instruction and feedback    Potential to Achieve Goals  Good    SLP Home Exercise Plan  Pt will complete HEP as assigned to facilitate carryover of treatment techniques in home environment    Consulted and Agree with Plan of Care  Patient  Patient will benefit from skilled therapeutic intervention in order to improve the following deficits and impairments:   1. Cognitive communication deficit       Problem List Patient Active Problem List   Diagnosis Date Noted  . Elevated liver enzymes 05/13/2019  . Memory problem 05/13/2019  . PFO (patent foramen ovale) 05/13/2019  . Elevated antinuclear antibody (ANA) level 05/13/2019  . Acute ischemic stroke (HCC) 05/10/2019  . Stroke (HCC) 05/10/2019  . Incarcerated epigastric hernia 04/26/2019  . Umbilical hernia 04/26/2019   Thank you,  Havery MorosDabney Marvell Tamer, CCC-SLP [phone]  [facility],Josilyn Shippee 06/07/2019, 12:19 PM   [facility] [address] [place], [place], [address] Phone: [phone]   Fax:  [phone]   Name: Carolyn Mclaughlin MRN: [id number] Date of Birth: [birth year]

## 2019-06-13 ENCOUNTER — Ambulatory Visit (HOSPITAL_COMMUNITY): Payer: BC Managed Care – PPO | Admitting: Speech Pathology

## 2019-06-14 ENCOUNTER — Ambulatory Visit (HOSPITAL_COMMUNITY): Payer: BC Managed Care – PPO | Admitting: Speech Pathology

## 2019-06-14 ENCOUNTER — Other Ambulatory Visit: Payer: Self-pay

## 2019-06-14 ENCOUNTER — Encounter (HOSPITAL_COMMUNITY): Payer: Self-pay | Admitting: Speech Pathology

## 2019-06-14 DIAGNOSIS — R41841 Cognitive communication deficit: Secondary | ICD-10-CM | POA: Diagnosis not present

## 2019-06-14 NOTE — Therapy (Signed)
[facility] [address] [place], [place], [address] Phone: [phone]   Fax:  [phone]  Speech Language Pathology Treatment  Patient Details  Name: Carolyn Mclaughlin MRN: [id number] Date of Birth: [birth year] Referring Provider (SLP): Margie Egeyrone Kyle, MD   Encounter Date: 06/14/2019  End of Session - 06/14/19 1013    Visit Number  4    Number of Visits  9    Date for SLP Re-Evaluation  06/30/19    Authorization Type  [employer] employee    SLP Start Time  1008    SLP Stop Time   1055    SLP Time Calculation (min)  47 min    Activity Tolerance  Patient tolerated treatment well       Past Medical History:  Diagnosis Date  . Headache   . Incarcerated epigastric hernia 04/26/2019  . Umbilical hernia 04/26/2019    Past Surgical History:  Procedure Laterality Date  . BUBBLE STUDY  05/13/2019   Procedure: BUBBLE STUDY;  Surgeon: Parke PoissonAcharya, Gayatri A, MD;  [facility];  Service: Cardiology;;  . LOOP RECORDER INSERTION Left 05/13/2019   Procedure: LOOP RECORDER INSERTION;  Surgeon: Regan Lemmingamnitz, Will Martin, MD;  [facility];  Service: Cardiovascular;  Laterality: Left;  . NO PAST SURGERIES    . TEE WITHOUT CARDIOVERSION N/A 05/13/2019   Procedure: TRANSESOPHAGEAL ECHOCARDIOGRAM (TEE);  Surgeon: Parke PoissonAcharya, Gayatri A, MD;  [facility];  Service: Cardiology;  Laterality: N/A;  . VENTRAL HERNIA REPAIR N/A 04/26/2019   Procedure: LAPRASCOPIC REPAIR OF INCARCERATED EPIGASTRIC HERNIA WITH MESH;  Surgeon: Claud KelpIngram, Haywood, MD;  [facility];  Service: General;  Laterality: N/A;    There were no vitals filed for this visit.  Subjective Assessment - 06/14/19 1012    Subjective  "I started back to work yesterday."    Currently in Pain?  No/denies       ADULT SLP TREATMENT - 06/14/19 1013      General Information   Behavior/Cognition  Alert;Pleasant mood;Cooperative    Patient Positioning  Upright in chair     Oral care provided  N/A    HPI  [name] is a 48 y.o. female with history of hernia surgery 04/26/2019 presenting with memory dysfunction since Thursday 05/05/2019.  [facility]  [facility] and found to have L thalamic infarct most likely due to small vessel disease, however cannot rule out embolic source given recent surgery/post op stat. CT head hypoattenuation left basal ganglia and thalamus. MRI  2 cm left thalamic infarct. Pt had SLE during acute stay with recommendation for OP SLP due to cognitive linguistic deficits noted during evaluation. Pt is referred to OP SLP by Dr. Margie Egeyrone Kyle. She will follow up with Shirley FriarJessica Vanschaik in mid July [facility]. 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       Treatment Provided   Treatment provided  Cognitive-Linquistic      Pain Assessment   Pain Assessment  No/denies pain      Cognitive-Linquistic Treatment   Treatment focused on  Aphasia;Cognition;Patient/family/caregiver education    Skilled Treatment SLP provided skilled interventions targeting memory, attention, and word finding activities while addressing the following goals: Pt will implement memory strategies in functional therapy activities with 90% acc with min cues. She completed recall with a distractor task with allowance for note taking with 100% acc;  Pt will complete selective and  alternating attention tasks (moderately complex) with 90% acc and mi/mod cues. Pt completed attention exercise with 100% acc. Pt will implement word-finding strategies with 90% accuracy when unable to verbalize desired word in conversation/functional tasks with mi/mod assist. N/A. Pt will increase divergent naming to 10+ items per concrete category with min cues. N/A. Pt will complete high-level word retrieval tasks with 95% acc and min assist. N/A        Assessment / Recommendations / Plan   Plan  Continue with current plan of  care      Progression Toward Goals   Progression toward goals  Progressing toward goals         SLP Short Term Goals - 06/14/19 1014      SLP SHORT TERM GOAL #1   Title  Pt will implement memory strategies in functional therapy activities with 90% acc with min cues    Baseline  0/5 recall    Time  4    Period  Weeks    Status  On-going    Target Date  07/07/19      SLP SHORT TERM GOAL #2   Title  Pt will complete selective and alternating attention tasks (moderately complex) with 90% acc and mi/mod cues.    Baseline  60%    Time  4    Period  Weeks    Status  On-going    Target Date  07/07/19      SLP SHORT TERM GOAL #3   Title  Pt will implement word-finding strategies with 90% accuracy when unable to verbalize desired word in conversation/functional tasks with mi/mod assist.    Baseline  BNT short form 8/15    Time  4    Period  Weeks    Status  On-going    Target Date  07/07/19      SLP SHORT TERM GOAL #4   Title  Pt will increase divergent naming to 10+ items per concrete category with min cues.    Baseline  6    Time  4    Period  Weeks    Status  On-going    Target Date  07/07/19      SLP SHORT TERM GOAL #5   Title  Pt will complete high-level word retrieval tasks with 95% acc and min assist    Baseline  70%    Time  4    Period  Weeks    Status  On-going    Target Date  07/07/19       SLP Long Term Goals - 06/14/19 1015      SLP LONG TERM GOAL #1   Title  Same as short term       Plan - 06/14/19 1014    Clinical Impression Statement  Pt presents with moderate cognitive linguistic deficits characterized by deficits in attention, memory (storage and retrieval), and dysnomia. Pt started back to work yesterday after being released from her PCP. Today's session focused on problem solving potential barriers at work. Pt agrees that a "to do" list with check boxes would be beneficial to keep instead of tossing out sticky notes, so that she can see what was  completed the previous day. SLP provided written list of recommendations to implement at work. The memory task today was modified to allow the Pt to take notes while hearing the information, as she will have access to this strategy at work (answering phones etc). Continue role playing scenarios next session.   Speech  Therapy Frequency  2x / week    Duration  4 weeks    Treatment/Interventions  Compensatory strategies;Cueing hierarchy;Functional tasks;Patient/family education;Cognitive reorganization;Compensatory techniques;Internal/external aids;SLP instruction and feedback    Potential to Achieve Goals  Good    SLP Home Exercise Plan  Pt will complete HEP as assigned to facilitate carryover of treatment techniques in home environment    Consulted and Agree with Plan of Care  Patient       Patient will benefit from skilled therapeutic intervention in order to improve the following deficits and impairments:   1. Cognitive communication deficit       Problem List Patient Active Problem List   Diagnosis Date Noted  . Elevated liver enzymes 05/13/2019  . Memory problem 05/13/2019  . PFO (patent foramen ovale) 05/13/2019  . Elevated antinuclear antibody (ANA) level 05/13/2019  . Acute ischemic stroke ([place]) 05/10/2019  . Stroke ([place]) 05/10/2019  . Incarcerated epigastric hernia 04/26/2019  . Umbilical hernia 68/34/1962   Thank you,  Genene Churn, [place]  [facility] 06/14/2019, 10:15 AM  [place] [address] [place], [place], [address] Phone: [phone]   Fax:  [phone]   Name: Carolyn Mclaughlin MRN: [id number] Date of Birth: 10-03-1971

## 2019-06-15 ENCOUNTER — Ambulatory Visit (INDEPENDENT_AMBULATORY_CARE_PROVIDER_SITE_OTHER): Payer: BC Managed Care – PPO | Admitting: *Deleted

## 2019-06-15 DIAGNOSIS — I639 Cerebral infarction, unspecified: Secondary | ICD-10-CM

## 2019-06-15 LAB — CUP PACEART REMOTE DEVICE CHECK
Date Time Interrogation Session: 20200715180846
Implantable Pulse Generator Implant Date: 20200612

## 2019-06-20 ENCOUNTER — Telehealth (HOSPITAL_COMMUNITY): Payer: Self-pay | Admitting: Speech Pathology

## 2019-06-20 ENCOUNTER — Ambulatory Visit (HOSPITAL_COMMUNITY): Payer: BC Managed Care – PPO | Admitting: Speech Pathology

## 2019-06-20 NOTE — Telephone Encounter (Signed)
SLP Telephone Call:  Pt did not show up for her 10 AM appointment, so SLP called to check on Pt. Pt apologized and stated that she was unaware of her appointment. She will be here for her 10 AM appointment on Wednesday.  Thank you,  Genene Churn, CCC-SLP [phone]

## 2019-06-21 ENCOUNTER — Encounter: Payer: Self-pay | Admitting: Adult Health

## 2019-06-21 ENCOUNTER — Other Ambulatory Visit: Payer: Self-pay

## 2019-06-21 ENCOUNTER — Encounter

## 2019-06-21 ENCOUNTER — Ambulatory Visit: Payer: BC Managed Care – PPO | Admitting: Adult Health

## 2019-06-21 VITALS — BP 163/100 | HR 84 | Temp 97.1°F | Ht 62.0 in | Wt 180.4 lb

## 2019-06-21 DIAGNOSIS — Q2112 Patent foramen ovale: Secondary | ICD-10-CM

## 2019-06-21 DIAGNOSIS — I639 Cerebral infarction, unspecified: Secondary | ICD-10-CM

## 2019-06-21 DIAGNOSIS — I10 Essential (primary) hypertension: Secondary | ICD-10-CM

## 2019-06-21 DIAGNOSIS — E785 Hyperlipidemia, unspecified: Secondary | ICD-10-CM

## 2019-06-21 DIAGNOSIS — I6381 Other cerebral infarction due to occlusion or stenosis of small artery: Secondary | ICD-10-CM

## 2019-06-21 DIAGNOSIS — Q211 Atrial septal defect: Secondary | ICD-10-CM | POA: Diagnosis not present

## 2019-06-21 DIAGNOSIS — I69319 Unspecified symptoms and signs involving cognitive functions following cerebral infarction: Secondary | ICD-10-CM

## 2019-06-21 NOTE — Progress Notes (Signed)
[facility] [address] [place]. [place] [address] [phone]       HOSPITAL FOLLOW UP NOTE  [name] Date of Birth:  1971/05/20 Medical Record Number:  [id number]   Reason for Referral:  hospital stroke follow up    CHIEF COMPLAINT:  Chief Complaint  Patient presents with  . Hospitalization Follow-up    Stroke f/u. Alone. Treatment room. No concerns at this time.     HPI: Carolyn Mclaughlin is being seen today for in office hospital follow-up regarding left thalamic infarct on 05/09/2019. History obtained from patient and chart review. Reviewed all radiology images and labs personally.  [name] is a 48 y.o. female with history of hernia surgery 04/26/2019 who presented on 05/09/2019 with memory dysfunction since Thursday 05/05/2019.  Stroke work-up revealed left thalamic infarct most likely due to small vessel disease however unable to rule out cardioembolic source given recent surgery/postop state.  Stroke work-up revealed PFO and recommended follow-up outpatient for PFO closure.  Full stroke work-up completed and all imaging personally reviewed.  CT head showed hypoattenuation left basal ganglia and thalamus.  MRI showed 2 cm left thalamic infarct.  Vessel imaging and 2D echo unremarkable.  TEE obtained which was positive for PFO but no evidence of thrombus.  Loop recorder placed for monitoring of potential atrial fibrillation.  TCD bubble positive for small right to left shunt with Valsalva only.  Lower extremity venous Doppler negative for DVT.  LDL 117 and A1c 5.8. Hypercoagulable work-up negative except elevated ESR 52 and positive ANA with recommendation of referral to rheumatology.  Recommended DAPT for 3 weeks then aspirin alone along with initiating atorvastatin 80 mg daily for secondary stroke prevention.  No prior history of HTN and possible undiagnosed chronic hypertension and recommended long-term BP goal normotensive range.  Other  stroke risk factors include EtOH use and obesity but no prior history of stroke.  Discharged home in stable condition with recommendation of outpatient SLP.  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  She continues to work with speech therapy at [facility].  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 She does feel as though her cognition has been slowly improving.  Completed 3 weeks DAPT and continues on aspirin 54m alone without bleeding or bruising Continues on atorvastatin 80 mg without myalgias Blood pressure elevated today patient states this is due to anxiety of appointment.  She does not routinely monitor at home but plans on obtaining BP cuff Loop recorder has not shown atrial fibrillation thus far No further concerns at this time Denies new or worsening stroke/TIA symptoms      ROS:   14 system review of systems performed and negative with exception of memory loss and cognitive deficit  PMH:  Past Medical History:  Diagnosis Date  . Headache   . Incarcerated epigastric hernia 04/26/2019  . Umbilical hernia 59/97/7414   PSH:  Past Surgical History:  Procedure Laterality Date  . BUBBLE STUDY  05/13/2019   Procedure: BUBBLE STUDY;  Surgeon: AElouise Munroe MD;  [facility]  Service: Cardiology;;  . LOOP RECORDER INSERTION Left 05/13/2019   Procedure: LOOP RECORDER INSERTION;  Surgeon: CConstance Haw MD;  [facility];  Service: Cardiovascular;  Laterality: Left;  . NO PAST SURGERIES    .  TEE WITHOUT CARDIOVERSION N/A 05/13/2019   Procedure: TRANSESOPHAGEAL ECHOCARDIOGRAM (TEE);  Surgeon: Elouise Munroe, MD;  Location: [place];  Service: Cardiology;  Laterality: N/A;  . VENTRAL HERNIA REPAIR N/A 04/26/2019   Procedure: LAPRASCOPIC  REPAIR OF INCARCERATED EPIGASTRIC HERNIA WITH MESH;  Surgeon: Fanny Skates, MD;  [facility];  Service: General;  Laterality: N/A;    Social History:  Social History   Socioeconomic History  . Marital status: Single    Spouse name: Not on file  . Number of children: Not on file  . Years of education: Not on file  . Highest education level: Not on file  Occupational History  . Not on file  Social Needs  . Financial resource strain: Not on file  . Food insecurity    Worry: Not on file    Inability: Not on file  . Transportation needs    Medical: Not on file    Non-medical: Not on file  Tobacco Use  . Smoking status: Never Smoker  . Smokeless tobacco: Never Used  Substance and Sexual Activity  . Alcohol use: Yes    Comment: socially-wine  . Drug use: Never  . Sexual activity: Not on file  Lifestyle  . Physical activity    Days per week: Not on file    Minutes per session: Not on file  . Stress: Not on file  Relationships  . Social Herbalist on phone: Not on file    Gets together: Not on file    Attends religious service: Not on file    Active member of club or organization: Not on file    Attends meetings of clubs or organizations: Not on file    Relationship status: Not on file  . Intimate partner violence    Fear of current or ex partner: Not on file    Emotionally abused: Not on file    Physically abused: Not on file    Forced sexual activity: Not on file  Other Topics Concern  . Not on file  Social History Narrative  . Not on file    Family History:  Family History  Family history unknown: Yes    Medications:   Current Outpatient Medications on File Prior to Visit  Medication Sig Dispense Refill  . aspirin 81 MG chewable tablet Chew 1 tablet (81 mg total) by mouth daily. 30 tablet 2  . atorvastatin (LIPITOR) 80 MG tablet Take 1 tablet (80 mg total) by mouth at bedtime. 30 tablet 2  . nabumetone (RELAFEN) 750 MG tablet Take 750 mg by  mouth 2 (two) times daily as needed for pain.     No current facility-administered medications on file prior to visit.     Allergies:  No Known Allergies   Physical Exam  Vitals:   06/21/19 0914  BP: (!) 163/100  Pulse: 84  Temp: (!) 97.1 F (36.2 C)  TempSrc: Oral  Weight: 180 lb 6.4 oz (81.8 kg)  Height: 5' 2" (1.575 m)   Body mass index is 33 kg/m. No exam data present  No flowsheet data found.   General: well developed, well nourished,  pleasant middle-aged African-American female, seated, in no evident distress Head: head normocephalic and atraumatic.   Neck: supple with no carotid or supraclavicular bruits Cardiovascular: regular rate and rhythm, no murmurs Musculoskeletal: no deformity Skin:  no rash/petichiae Vascular:  Normal pulses all extremities   Neurologic Exam Mental Status: Awake and fully alert. Oriented to place and time.  Mood and affect appropriate.  Able to name 6 4-legged animals in 60 seconds.  Able to do serial additions without difficulty.  Recall 0/3.  Cranial Nerves: Fundoscopic exam reveals sharp disc margins. Pupils equal, briskly reactive to light. Extraocular movements full without nystagmus. Visual fields full to confrontation. Hearing intact. Facial sensation intact. Face, tongue, palate moves normally and symmetrically.  Motor: Normal bulk and tone. Normal strength in all tested extremity muscles. Sensory.: intact to touch , pinprick , position and vibratory sensation.  Coordination: Rapid alternating movements normal in all extremities. Finger-to-nose and heel-to-shin performed accurately bilaterally. Gait and Station: Arises from chair without difficulty. Stance is normal. Gait demonstrates normal stride length and balance Reflexes: 1+ and symmetric. Toes downgoing.     NIHSS  0 Modified Rankin  1 ROPE 6   Diagnostic Data (Labs, Imaging, Testing)  CT HEAD WO CONTRAST 05/09/2019 IMPRESSION: Hypoattenuation in the left basal  ganglia and thalamus, possibly subacute infarct. No acute hemorrhage.  MR BRAIN WO CONTRAST MR MRA HEAD  05/10/2019 IMPRESSION: MRI HEAD IMPRESSION:  1. 2 cm subacute ischemic nonhemorrhagic infarct involving the ventromedial left thalamus. 2. Otherwise normal brain MRI for age.  MRA HEAD IMPRESSION:  Normal intracranial MRA. No large vessel occlusion. No hemodynamically significant or correctable stenosis.  ECHOCARDIOGRAM 05/10/2019 IMPRESSIONS  1. The left ventricle has normal systolic function, with an ejection fraction of 55-60%. The cavity size was normal. Left ventricular diastolic parameters were normal. No evidence of left ventricular regional wall motion abnormalities.  2. The right ventricle has normal systolic function. The cavity was normal. There is no increase in right ventricular wall thickness.  TEE 05/13/2019 IMPRESSIONS  1. Positive PFO. There is a left to right shunt seen by color flow Doppler and right to left shunt seen with agitated saline injection.  2. The left ventricle has normal systolic function, with an ejection fraction of 55-60%. The cavity size was normal.  3. The right ventricle has normal systolc function. The cavity was normal. There is no increase in right ventricular wall thickness.  4. No evidence of a thrombus present in the left atrial appendage. Emptying velocity 80 cm/s.  5. Aortic valve regurgitation is trivial by color flow Doppler.  6. The aortic root and ascending aorta are normal in size and structure.  7. There is evidence of plaque in the in the aortic arch, which is trivial.    ASSESSMENT: Carolyn Mclaughlin is a 49 y.o. year old female here with left thalamic infarct most likely secondary to small vessel disease however cannot rule out cardioembolic source due to recent surgery/postop state on 05/09/2019.  Loop recorder placed to assess for atrial fibrillation.  Evidence of PFO and recommended outpatient follow-up for PFO closure.   Vascular risk factors include HTN, HLD, possible undiagnosed chronic hypertension, PFO, EtOH use and obesity. Residual deficits of cognitive deficits but overall improving.     PLAN:  1. Left thalamic infarct: Continue aspirin 81 mg daily  and atorvastatin 80 mg for secondary stroke prevention. Maintain strict control of hypertension with blood pressure goal below 130/90, diabetes with hemoglobin A1c goal below 6.5% and cholesterol with LDL cholesterol (bad cholesterol) goal below 70 mg/dL.  I also advised the patient to eat a healthy diet with plenty of whole grains, cereals, fruits and vegetables, exercise regularly with at least 30 minutes of continuous activity daily and maintain ideal body weight.  Continue to monitor loop recorder for atrial fibrillation 2. PFO: referral placed to cardiology for evaluation  regarding potential PFO closure.  Rope score 6 3. Likely HTN: Not currently on antihypertensives as no prior history of HTN.  Today's BP elevated and advised to obtain a BP cuff to monitor at home. If BP remains elevated or becomes symptomatic, advised to proceed to ED. Also advised to contact PCP for possible need of BP management.  4. HLD: Advised to continue current treatment regimen along with continued follow-up with PCP for future prescribing and monitoring of lipid panel   Follow up in 3 months or call earlier if needed   Greater than 50% of time during this 45 minute visit was spent on counseling, explanation of diagnosis of left thalamic infarct, reviewing risk factor management of HTN, HLD, and PFO, planning of further management along with potential future management, and discussion with patient and family answering all questions.    Venancio Poisson, AGNP-BC  [facility] [address] [place], [place] [address]  Phone [phone] Fax [phone] Note: This document was prepared with digital dictation and possible smart phrase  technology. Any transcriptional errors that result from this process are unintentional.

## 2019-06-21 NOTE — Patient Instructions (Addendum)
Continue aspirin 81 mg daily  and lipitor 80mg   for secondary stroke prevention  Continue to follow up with PCP regarding cholesterol management - we will check cholesterol panel today  Referral placed to cardiology Dr. Burt Knack   Continue to monitor loop recorder  Continue to work with speech therapy for ongoing memory and cognitive deficits  Continue to monitor blood pressure at home  Maintain strict control of hypertension with blood pressure goal below 130/90, diabetes with hemoglobin A1c goal below 6.5% and cholesterol with LDL cholesterol (bad cholesterol) goal below 70 mg/dL. I also advised the patient to eat a healthy diet with plenty of whole grains, cereals, fruits and vegetables, exercise regularly and maintain ideal body weight.  Followup in the future with me in 3 months or call earlier if needed       Thank you for coming to see [place] at [facility]. I hope we have been able to provide you high quality care today.  You may receive a patient satisfaction survey over the next few weeks. We would appreciate your feedback and comments so that we may continue to improve ourselves and the health of our patients.

## 2019-06-21 NOTE — Progress Notes (Signed)
I agree with the above plan 

## 2019-06-22 ENCOUNTER — Ambulatory Visit (HOSPITAL_COMMUNITY): Payer: BC Managed Care – PPO | Admitting: Speech Pathology

## 2019-06-22 ENCOUNTER — Telehealth: Payer: Self-pay | Admitting: *Deleted

## 2019-06-22 ENCOUNTER — Other Ambulatory Visit: Payer: Self-pay

## 2019-06-22 ENCOUNTER — Encounter (HOSPITAL_COMMUNITY): Payer: Self-pay | Admitting: Speech Pathology

## 2019-06-22 DIAGNOSIS — R41841 Cognitive communication deficit: Secondary | ICD-10-CM

## 2019-06-22 LAB — LIPID PANEL
Chol/HDL Ratio: 2 ratio (ref 0.0–4.4)
Cholesterol, Total: 114 mg/dL (ref 100–199)
HDL: 56 mg/dL (ref >39)
LDL Calculated: 46 mg/dL (ref 0–99)
Triglycerides: 59 mg/dL (ref 0–149)
VLDL Cholesterol Cal: 12 mg/dL (ref 5–40)

## 2019-06-22 NOTE — Therapy (Signed)
[place], [place], [address] Phone: [phone]   Fax:  [phone]  Speech Language Pathology Treatment  Patient Details  Name: NICA FRISKE MRN: [id number] Date of Birth: July 23, 1971 Referring Provider (SLP): Cherylann Ratel, MD   Encounter Date: 06/22/2019  End of Session - 06/22/19 1011    Visit Number  5    Number of Visits  9    Date for SLP Re-Evaluation  06/30/19    Authorization Type  [employer] employee    SLP Start Time  1000    SLP Stop Time   1050    SLP Time Calculation (min)  50 min    Activity Tolerance  Patient tolerated treatment well       Past Medical History:  Diagnosis Date  . Headache   . Incarcerated epigastric hernia 04/26/2019  . Umbilical hernia 4/76/5465    Past Surgical History:  Procedure Laterality Date  . BUBBLE STUDY  05/13/2019   Procedure: BUBBLE STUDY;  Surgeon: Elouise Munroe, MD;  Location: [place];  Service: Cardiology;;  . LOOP RECORDER INSERTION Left 05/13/2019   Procedure: LOOP RECORDER INSERTION;  Surgeon: Constance Haw, MD;  Location: [place] [facility];  Service: Cardiovascular;  Laterality: Left;  . NO PAST SURGERIES    . TEE WITHOUT CARDIOVERSION N/A 05/13/2019   Procedure: TRANSESOPHAGEAL ECHOCARDIOGRAM (TEE);  Surgeon: Elouise Munroe, MD;  Location: [place];  Service: Cardiology;  Laterality: N/A;  . VENTRAL HERNIA REPAIR N/A 04/26/2019   Procedure: LAPRASCOPIC REPAIR OF INCARCERATED EPIGASTRIC HERNIA WITH MESH;  Surgeon: Fanny Skates, MD;  [facility];  Service: General;  Laterality: N/A;    There were no vitals filed for this visit.  Subjective Assessment - 06/22/19 1009    Subjective  "I think it is going good."    Currently in Pain?  No/denies       ADULT SLP TREATMENT - 06/22/19 1011      General Information   Behavior/Cognition  Alert;Pleasant mood;Cooperative    Patient Positioning  Upright in chair    Oral  care provided  N/A    HPI  Ms. HULA TASSO is a 48 y.o. female with history of hernia surgery 04/26/2019 presenting with memory dysfunction since Thursday 05/05/2019.  Pt  presented to [facility]19 and found to have L thalamic infarct most likely due to small vessel disease, however cannot rule out embolic source given recent surgery/post op stat. CT head hypoattenuation left basal ganglia and thalamus. MRI  2 cm left thalamic infarct. Pt had SLE during acute stay with recommendation for OP SLP due to cognitive linguistic deficits noted during evaluation. Pt is referred to OP SLP by Dr. Cherylann Ratel. She will follow up with Sherald Barge in mid July with [facility]. 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       Treatment Provided   Treatment provided  Cognitive-Linquistic      Pain Assessment   Pain Assessment  No/denies pain      Cognitive-Linquistic Treatment   Treatment focused on  Aphasia;Cognition;Patient/family/caregiver education    Skilled Treatment  SLP provided skilled interventions targeting memory, attention, and word finding activities while addressing the following goals: Pt will implement memory strategies in functional therapy activities with 90% acc with min cues. Visual recall and name recall task: 3/5 after spending 10+ minutes with strategies.  Pt will complete selective and alternating  attention tasks (moderately complex) with 90% acc and mi/mod cues. Pt appeared to successfully manage telephone calls from work while completing therapy task. Pt will implement word-finding strategies with 90% accuracy when unable to verbalize desired word in conversation/functional tasks with mi/mod assist. Mild dysnomia noted for low frequency words in conversation (stethascope). Pt will increase divergent naming to 10+ items per concrete category with min cues. N/A. Pt will complete high-level word retrieval tasks with 95% acc and  min assist. N/A         Progression Toward Goals   Progression toward goals  Progressing toward goals         SLP Short Term Goals - 06/22/19 1012      SLP SHORT TERM GOAL #1   Title  Pt will implement memory strategies in functional therapy activities with 90% acc with min cues    Baseline  0/5 recall    Time  4    Period  Weeks    Status  On-going    Target Date  07/07/19      SLP SHORT TERM GOAL #2   Title  Pt will complete selective and alternating attention tasks (moderately complex) with 90% acc and mi/mod cues.    Baseline  60%    Time  4    Period  Weeks    Status  On-going    Target Date  07/07/19      SLP SHORT TERM GOAL #3   Title  Pt will implement word-finding strategies with 90% accuracy when unable to verbalize desired word in conversation/functional tasks with mi/mod assist.    Baseline  BNT short form 8/15    Time  4    Period  Weeks    Status  On-going    Target Date  07/07/19      SLP SHORT TERM GOAL #4   Title  Pt will increase divergent naming to 10+ items per concrete category with min cues.    Baseline  6    Time  4    Period  Weeks    Status  On-going    Target Date  07/07/19      SLP SHORT TERM GOAL #5   Title  Pt will complete high-level word retrieval tasks with 95% acc and min assist    Baseline  70%    Time  4    Period  Weeks    Status  On-going    Target Date  07/07/19       SLP Long Term Goals - 06/22/19 1013      SLP LONG TERM GOAL #1   Title  Same as short term       Plan - 06/22/19 1012    Clinical Impression Statement  Pt presents with mild/moderate cognitive linguistic deficits characterized by deficits in attention, memory (storage and retrieval), and dysnomia. Pt saw the neurology team yesterday and was able to tell me that she was asked to record her BP, but was unable to tell me what a goal BP would be. SLP read neurology note with goal to be under 130/90 and wrote it down for Pt and gave her a homework assignment  to record her BP in the AM and PM for one week and to bring recordings with her to therapy next week. Pt utilized memory strategies with SLP cueing for visual memory and name recall task, however only recalled 3/5 names to faces independently. She continues to benefit from written cues. She did not  bring a notebook with her to therapy today. She was reminded to bring a notebook with her to all appointments. Continue role playing scenarios next session.    Speech Therapy Frequency  2x / week    Duration  4 weeks    Treatment/Interventions  Compensatory strategies;Cueing hierarchy;Functional tasks;Patient/family education;Cognitive reorganization;Compensatory techniques;Internal/external aids;SLP instruction and feedback    Potential to Achieve Goals  Good    SLP Home Exercise Plan  Pt will complete HEP as assigned to facilitate carryover of treatment techniques in home environment    Consulted and Agree with Plan of Care  Patient       Patient will benefit from skilled therapeutic intervention in order to improve the following deficits and impairments:   1. Cognitive communication deficit       Problem List Patient Active Problem List   Diagnosis Date Noted  . Elevated liver enzymes 05/13/2019  . Memory problem 05/13/2019  . PFO (patent foramen ovale) 05/13/2019  . Elevated antinuclear antibody (ANA) level 05/13/2019  . Acute ischemic stroke (HCC) 05/10/2019  . Stroke (HCC) 05/10/2019  . Incarcerated epigastric hernia 04/26/2019  . Umbilical hernia 04/26/2019   Thank you,  Havery MorosDabney Tajanae Guilbault, CCC-SLP [phone]  [facility],Miciah Covelli 06/22/2019, 10:13 AM  [facility] [address] [place], [place], [address] Phone: [phone]   Fax:  [phone]   Name: Roselie AwkwardCynthia M Ruggirello MRN: [id number] Date of Birth: 04/27/1971

## 2019-06-22 NOTE — Telephone Encounter (Signed)
Spoke with patient and informed her that she has satisfactory cholesterol levels with improvement of LDL or the bad cholesterol. Advised she please continue her current regimen.  Patient verbalized understanding, appreciation.

## 2019-06-25 NOTE — Progress Notes (Signed)
Carelink Summary Report / Loop Recorder 

## 2019-06-27 ENCOUNTER — Other Ambulatory Visit: Payer: Self-pay

## 2019-06-27 ENCOUNTER — Telehealth: Payer: Self-pay

## 2019-06-27 ENCOUNTER — Ambulatory Visit (HOSPITAL_COMMUNITY): Payer: BC Managed Care – PPO | Admitting: Speech Pathology

## 2019-06-27 ENCOUNTER — Encounter (HOSPITAL_COMMUNITY): Payer: Self-pay | Admitting: Speech Pathology

## 2019-06-27 DIAGNOSIS — R41841 Cognitive communication deficit: Secondary | ICD-10-CM | POA: Diagnosis not present

## 2019-06-27 NOTE — Telephone Encounter (Signed)
Called to schedule the patient for PFO consult with Dr. Burt Knack. Offered several tentative dates and times. The patient declined scheduling an appointment and stated she will call back to schedule if she chooses to proceed with consult. Office number provided. She was grateful for assistance.

## 2019-06-27 NOTE — Therapy (Signed)
[place], [place], [address] Phone: [phone]   Fax:  [phone]  Speech Language Pathology Treatment  Patient Details  Name: Carolyn Mclaughlin MRN: [id number] Date of Birth: 01-Oct-1971 Referring Provider (SLP): Cherylann Ratel, MD   Encounter Date: 06/27/2019  End of Session - 06/27/19 1146    Visit Number  6    Number of Visits  9    Date for SLP Re-Evaluation  06/30/19    Authorization Type  [employer] employee    SLP Start Time  1010    SLP Stop Time   1100    SLP Time Calculation (min)  50 min    Activity Tolerance  Patient tolerated treatment well       Past Medical History:  Diagnosis Date  . Headache   . Incarcerated epigastric hernia 04/26/2019  . Umbilical hernia 0/25/4270    Past Surgical History:  Procedure Laterality Date  . BUBBLE STUDY  05/13/2019   Procedure: BUBBLE STUDY;  Surgeon: Elouise Munroe, MD;  Location: [place];  Service: Cardiology;;  . LOOP RECORDER INSERTION Left 05/13/2019   Procedure: LOOP RECORDER INSERTION;  Surgeon: Constance Haw, MD;  Location: [place] [facility];  Service: Cardiovascular;  Laterality: Left;  . NO PAST SURGERIES    . TEE WITHOUT CARDIOVERSION N/A 05/13/2019   Procedure: TRANSESOPHAGEAL ECHOCARDIOGRAM (TEE);  Surgeon: Elouise Munroe, MD;  Location: [place];  Service: Cardiology;  Laterality: N/A;  . VENTRAL HERNIA REPAIR N/A 04/26/2019   Procedure: LAPRASCOPIC REPAIR OF INCARCERATED EPIGASTRIC HERNIA WITH MESH;  Surgeon: Fanny Skates, MD;  [facility];  Service: General;  Laterality: N/A;    There were no vitals filed for this visit.  Subjective Assessment - 06/27/19 1027    Subjective  "My blood pressure has been high."    Currently in Pain?  No/denies            ADULT SLP TREATMENT - 06/27/19 0001      General Information   Behavior/Cognition  Alert;Pleasant mood;Cooperative    Patient Positioning  Upright in  chair    Oral care provided  N/A    HPI  Carolyn Mclaughlin is a 48 y.o. female with history of hernia surgery 04/26/2019 presenting with memory dysfunction since Thursday 05/05/2019.  Pt  presented to [facility] 05/10/19 and found to have L thalamic infarct most likely due to small vessel disease, however cannot rule out embolic source given recent surgery/post op stat. CT head hypoattenuation left basal ganglia and thalamus. MRI  2 cm left thalamic infarct. Pt had SLE during acute stay with recommendation for OP SLP due to cognitive linguistic deficits noted during evaluation. Pt is referred to OP SLP by Dr. Cherylann Ratel. She will follow up with Sherald Barge in mid July with [facility]. 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       Treatment Provided   Treatment provided  Cognitive-Linquistic      Pain Assessment   Pain Assessment  No/denies pain      Cognitive-Linquistic Treatment   Treatment focused on  Aphasia;Cognition;Patient/family/caregiver education    Skilled Treatment  SLP provided skilled interventions targeting memory, attention, and word finding activities while addressing the following goals: Pt will implement memory strategies in functional therapy activities with 90% acc with min cues. Auditory recall of short paragraph= 80-100%% with use of note taking, 0-42% without use of  notes (even when able to take notes and put away). Reading recall: 0%   Pt will complete selective and alternating attention tasks (moderately complex) with 90% acc and mi/mod cues. N/A Pt will implement word-finding strategies with 90% accuracy when unable to verbalize desired word in conversation/functional tasks with mi/mod assist. WNL; Pt will increase divergent naming to 10+ items per concrete category with min cues. N/A. Pt will complete high-level word retrieval tasks with 95% acc and min assist. N/A         Assessment / Recommendations / Plan    Plan  Continue with current plan of care       SLP Education - 06/27/19 1129    Education Details  provided homework focusing on recall of written information    Person(s) Educated  Patient    Methods  Explanation;Handout    Comprehension  Verbalized understanding       SLP Short Term Goals - 06/27/19 1146      SLP SHORT TERM GOAL #1   Title  Pt will implement memory strategies in functional therapy activities with 90% acc with min cues    Baseline  0/5 recall    Time  4    Period  Weeks    Status  On-going    Target Date  07/07/19      SLP SHORT TERM GOAL #2   Title  Pt will complete selective and alternating attention tasks (moderately complex) with 90% acc and mi/mod cues.    Baseline  60%    Time  4    Period  Weeks    Status  On-going    Target Date  07/07/19      SLP SHORT TERM GOAL #3   Title  Pt will implement word-finding strategies with 90% accuracy when unable to verbalize desired word in conversation/functional tasks with mi/mod assist.    Baseline  BNT short form 8/15    Time  4    Period  Weeks    Status  On-going    Target Date  07/07/19      SLP SHORT TERM GOAL #4   Title  Pt will increase divergent naming to 10+ items per concrete category with min cues.    Baseline  6    Time  4    Period  Weeks    Status  On-going    Target Date  07/07/19      SLP SHORT TERM GOAL #5   Title  Pt will complete high-level word retrieval tasks with 95% acc and min assist    Baseline  70%    Time  4    Period  Weeks    Status  On-going    Target Date  07/07/19       SLP Long Term Goals - 06/27/19 1147      SLP LONG TERM GOAL #1   Title  Same as short term       Plan - 06/27/19 1146    Clinical Impression Statement  Pt presents with mild/moderate cognitive linguistic deficits characterized by deficits in attention, memory (storage and retrieval), and mild dysnomia. She completed homework assignment to record blood pressure recordings two times a day for the  week. She noted that her recordings were high. Today's session focused on recall of auditory paragraphs and recall of written material. Recall accuracy without use of note taking and ability to refer back was 0%. She improves to 100% acc with use of note taking and  ability to refer back to notes. Accuracy decreased to 42% when notes were removed. Pt became discouraged today and wants to know if she will bet back to normal. SLP reiterated that her stroke was only ~7 weeks ago and that she will continue to have some spontaneous recovery. SLP also suggested possible neuropsych testing in a couple of months to help pinpoint deficit areas. Pt reports that she is doing well at work and is able to compensate for memory/recall deficits with ues of written cues in that environment. It is paramount that she write everything down now and it is difficult for her to remember to do this for items that are not related to work. Will likely discharge from SLP therapy after next session and consider therapy again if Pt struggles at work and/or after neuropsych testing.     Speech Therapy Frequency  2x / week    Duration  4 weeks    Treatment/Interventions  Compensatory strategies;Cueing hierarchy;Functional tasks;Patient/family education;Cognitive reorganization;Compensatory techniques;Internal/external aids;SLP instruction and feedback    Potential to Achieve Goals  Good    SLP Home Exercise Plan  Pt will complete HEP as assigned to facilitate carryover of treatment techniques in home environment    Consulted and Agree with Plan of Care  Patient       Patient will benefit from skilled therapeutic intervention in order to improve the following deficits and impairments:   1. Cognitive communication deficit       Problem List Patient Active Problem List   Diagnosis Date Noted  . Elevated liver enzymes 05/13/2019  . Memory problem 05/13/2019  . PFO (patent foramen ovale) 05/13/2019  . Elevated antinuclear antibody  (ANA) level 05/13/2019  . Acute ischemic stroke (HCC) 05/10/2019  . Stroke (HCC) 05/10/2019  . Incarcerated epigastric hernia 04/26/2019  . Umbilical hernia 04/26/2019   Thank you,  Havery MorosDabney Porter, CCC-SLP [phone]  [facility],DABNEY 06/27/2019, 11:49 AM  [facility] [address] [place], [place], [address] Phone: [phone]   Fax:  [phone]   Name: Carolyn AwkwardCynthia M Mclaughlin MRN: [id number] Date of Birth: 01/25/1971

## 2019-06-29 ENCOUNTER — Ambulatory Visit (HOSPITAL_COMMUNITY): Payer: BC Managed Care – PPO | Admitting: Speech Pathology

## 2019-06-29 ENCOUNTER — Other Ambulatory Visit: Payer: Self-pay

## 2019-06-29 ENCOUNTER — Encounter (HOSPITAL_COMMUNITY): Payer: Self-pay | Admitting: Speech Pathology

## 2019-06-29 DIAGNOSIS — R41841 Cognitive communication deficit: Secondary | ICD-10-CM

## 2019-06-29 NOTE — Therapy (Signed)
[facility] [place], [place], [address] Phone: [phone]   Fax:  [phone]  Speech Language Pathology Treatment  Patient Details  Name: Carolyn Mclaughlin MRN: [id number] Date of Birth: 1971-05-08 Referring Provider (SLP): Cherylann Ratel, MD   Encounter Date: 06/29/2019  End of Session - 06/29/19 1054    Visit Number  7    Number of Visits  9    Date for SLP Re-Evaluation  06/30/19    Authorization Type  [employer] employee    SLP Start Time  1000    SLP Stop Time   1054    SLP Time Calculation (min)  54 min    Activity Tolerance  Patient tolerated treatment well       Past Medical History:  Diagnosis Date  . Headache   . Incarcerated epigastric hernia 04/26/2019  . Umbilical hernia 8/81/1031    Past Surgical History:  Procedure Laterality Date  . BUBBLE STUDY  05/13/2019   Procedure: BUBBLE STUDY;  Surgeon: Elouise Munroe, MD;  Location: [place];  Service: Cardiology;;  . LOOP RECORDER INSERTION Left 05/13/2019   Procedure: LOOP RECORDER INSERTION;  Surgeon: Constance Haw, MD;  Location: [place] [facility];  Service: Cardiovascular;  Laterality: Left;  . NO PAST SURGERIES    . TEE WITHOUT CARDIOVERSION N/A 05/13/2019   Procedure: TRANSESOPHAGEAL ECHOCARDIOGRAM (TEE);  Surgeon: Elouise Munroe, MD;  Location: [place];  Service: Cardiology;  Laterality: N/A;  . VENTRAL HERNIA REPAIR N/A 04/26/2019   Procedure: LAPRASCOPIC REPAIR OF INCARCERATED EPIGASTRIC HERNIA WITH MESH;  Surgeon: Fanny Skates, MD;  [facility];  Service: General;  Laterality: N/A;    There were no vitals filed for this visit.  Subjective Assessment - 06/29/19 1017    Subjective  "I am writing everything down."    Currently in Pain?  No/denies       ADULT SLP TREATMENT - 06/29/19 0001      General Information   Behavior/Cognition  Alert;Pleasant mood;Cooperative    Patient Positioning  Upright in chair    Oral care provided  N/A    HPI  Carolyn Mclaughlin is a 48 y.o. female with history of hernia surgery 04/26/2019 presenting with memory dysfunction since Thursday 05/05/2019.  Pt  presented to [facility] 05/10/19 and found to have L thalamic infarct most likely due to small vessel disease, however cannot rule out embolic source given recent surgery/post op stat. CT head hypoattenuation left basal ganglia and thalamus. MRI  2 cm left thalamic infarct. Pt had SLE during acute stay with recommendation for OP SLP due to cognitive linguistic deficits noted during evaluation. Pt is referred to OP SLP by Dr. Cherylann Ratel. She will follow up with Sherald Barge in mid July with [facility]. 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       Treatment Provided   Treatment provided  Cognitive-Linquistic      Pain Assessment   Pain Assessment  No/denies pain      Cognitive-Linquistic Treatment   Treatment focused on  Aphasia;Cognition;Patient/family/caregiver education    Skilled Treatment  SLP provided skilled interventions targeting memory, attention, and word finding activities while addressing the following goals: Pt will implement memory strategies in functional therapy activities with 90% acc with min cues. 100% with use of written cues.  Pt will complete selective and alternating attention tasks (moderately complex) with 90% acc and mi/mod  cues. Met. Pt will implement word-finding strategies with 90% accuracy when unable to verbalize desired word in conversation/functional tasks with mi/mod assist. Met. Pt will increase divergent naming to 10+ items per concrete category with min cues.Met.  Pt will complete high-level word retrieval tasks with 95% acc and min assist. Met        Assessment / Recommendations / Plan   Plan  Discharge SLP treatment due to (comment)       SLP Education - 06/29/19 1025    Education Details  Plan for discharge from  therapy    Person(s) Educated  Patient    Methods  Explanation;Handout    Comprehension  Verbalized understanding       SLP Short Term Goals - 06/29/19 1054      SLP SHORT TERM GOAL #1   Title  Pt will implement memory strategies in functional therapy activities with 90% acc with min cues    Baseline  0/5 recall    Time  4    Period  Weeks    Status  Achieved    Target Date  07/07/19      SLP SHORT TERM GOAL #2   Title  Pt will complete selective and alternating attention tasks (moderately complex) with 90% acc and mi/mod cues.    Baseline  60%    Time  4    Period  Weeks    Status  Achieved    Target Date  07/07/19      SLP SHORT TERM GOAL #3   Title  Pt will implement word-finding strategies with 90% accuracy when unable to verbalize desired word in conversation/functional tasks with mi/mod assist.    Baseline  BNT short form 8/15    Time  4    Period  Weeks    Status  Achieved    Target Date  07/07/19      SLP SHORT TERM GOAL #4   Title  Pt will increase divergent naming to 10+ items per concrete category with min cues.    Baseline  6    Time  4    Period  Weeks    Status  Achieved    Target Date  07/07/19      SLP SHORT TERM GOAL #5   Title  Pt will complete high-level word retrieval tasks with 95% acc and min assist    Baseline  70%    Time  4    Period  Weeks    Status  Achieved    Target Date  07/07/19       SLP Long Term Goals - 06/27/19 1147      SLP LONG TERM GOAL #1   Title  Same as short term       Plan - 06/29/19 1054    Clinical Impression Statement  Pt presents with mild/moderate cognitive linguistic deficits characterized by deficits in attention and memory (storage and retrieval). She has continued to record her BP readings. Today's session focused on recall of auditory paragraphs and recall of written material when Pt allowed to use written cues throughout. SLP reiterated that her stroke was only ~7 weeks ago and that she will continue to  have some spontaneous recovery. SLP also suggested possible neuropsych testing in a couple of months to help pinpoint deficit areas. Pt reports that she is doing well at work and is able to compensate for memory/recall deficits with ues of written cues in that environment. It is paramount that she write everything  down now and it is difficult for her to remember to do this for items that are not related to work. Pt will use a combination of sticky notes, a notebook, and a calendar to help organize her information at work. Will discharge from SLP therapy this date and consider therapy again if Pt struggles at work and/or after neuropsych testing.    Treatment/Interventions  Compensatory strategies;Cueing hierarchy;Functional tasks;Patient/family education;Cognitive reorganization;Compensatory techniques;Internal/external aids;SLP instruction and feedback    Potential to Achieve Goals  Good    SLP Home Exercise Plan  Pt will complete HEP as assigned to facilitate carryover of treatment techniques in home environment    Consulted and Agree with Plan of Care  Patient       Patient will benefit from skilled therapeutic intervention in order to improve the following deficits and impairments:   1. Cognitive communication deficit       Problem List Patient Active Problem List   Diagnosis Date Noted  . Elevated liver enzymes 05/13/2019  . Memory problem 05/13/2019  . PFO (patent foramen ovale) 05/13/2019  . Elevated antinuclear antibody (ANA) level 05/13/2019  . Acute ischemic stroke ([place]) 05/10/2019  . Stroke ([place]) 05/10/2019  . Incarcerated epigastric hernia 04/26/2019  . Umbilical hernia 60/47/9987    SPEECH THERAPY DISCHARGE SUMMARY  Visits from Start of Care: 7  Current functional level related to goals / functional outcomes: Pt met short term goals and is pleased with current level of function.   Remaining deficits: Working memory deficits persist, however she compensates well with  use of note-taking and written cues.   Education / Equipment: completed  Plan: Patient agrees to discharge.  Patient goals were met. Patient is being discharged due to being pleased with the current functional level.  ?????           Thank you,  Genene Churn, [place]  [facility] 06/29/2019, 10:55 AM  [facility] [address] [place], [place], [address] Phone: [phone]   Fax:  [phone]   Name: Carolyn Mclaughlin MRN: [id number] Date of Birth: 06-05-1971

## 2019-07-18 ENCOUNTER — Ambulatory Visit (INDEPENDENT_AMBULATORY_CARE_PROVIDER_SITE_OTHER): Payer: BC Managed Care – PPO | Admitting: *Deleted

## 2019-07-18 DIAGNOSIS — I639 Cerebral infarction, unspecified: Secondary | ICD-10-CM

## 2019-07-18 LAB — CUP PACEART REMOTE DEVICE CHECK
Date Time Interrogation Session: 20200817181332
Implantable Pulse Generator Implant Date: 20200612

## 2019-07-26 NOTE — Progress Notes (Signed)
Carelink Summary Report / Loop Recorder 

## 2019-08-22 ENCOUNTER — Ambulatory Visit (INDEPENDENT_AMBULATORY_CARE_PROVIDER_SITE_OTHER): Payer: BC Managed Care – PPO | Admitting: *Deleted

## 2019-08-22 DIAGNOSIS — I639 Cerebral infarction, unspecified: Secondary | ICD-10-CM | POA: Diagnosis not present

## 2019-08-22 LAB — CUP PACEART REMOTE DEVICE CHECK
Date Time Interrogation Session: 20200919183931
Implantable Pulse Generator Implant Date: 20200612

## 2019-08-30 NOTE — Progress Notes (Signed)
Carelink Summary Report / Loop Recorder 

## 2019-09-22 ENCOUNTER — Ambulatory Visit (INDEPENDENT_AMBULATORY_CARE_PROVIDER_SITE_OTHER): Payer: BC Managed Care – PPO | Admitting: *Deleted

## 2019-09-22 DIAGNOSIS — I639 Cerebral infarction, unspecified: Secondary | ICD-10-CM | POA: Diagnosis not present

## 2019-09-22 LAB — CUP PACEART REMOTE DEVICE CHECK
Date Time Interrogation Session: 20201022164751
Implantable Pulse Generator Implant Date: 20200612

## 2019-10-04 NOTE — Progress Notes (Signed)
Carelink Summary Report / Loop Recorder 

## 2019-10-25 ENCOUNTER — Ambulatory Visit (INDEPENDENT_AMBULATORY_CARE_PROVIDER_SITE_OTHER): Payer: BC Managed Care – PPO | Admitting: *Deleted

## 2019-10-25 DIAGNOSIS — I639 Cerebral infarction, unspecified: Secondary | ICD-10-CM

## 2019-10-25 LAB — CUP PACEART REMOTE DEVICE CHECK
Date Time Interrogation Session: 20201124134655
Implantable Pulse Generator Implant Date: 20200612

## 2019-11-22 NOTE — Progress Notes (Signed)
ILR remote 

## 2019-11-28 ENCOUNTER — Ambulatory Visit (INDEPENDENT_AMBULATORY_CARE_PROVIDER_SITE_OTHER): Payer: BC Managed Care – PPO | Admitting: *Deleted

## 2019-11-28 DIAGNOSIS — I639 Cerebral infarction, unspecified: Secondary | ICD-10-CM

## 2019-11-28 LAB — CUP PACEART REMOTE DEVICE CHECK
Date Time Interrogation Session: 20201227134506
Implantable Pulse Generator Implant Date: 20200612

## 2019-12-29 ENCOUNTER — Ambulatory Visit (INDEPENDENT_AMBULATORY_CARE_PROVIDER_SITE_OTHER): Payer: BC Managed Care – PPO | Admitting: *Deleted

## 2019-12-29 DIAGNOSIS — I639 Cerebral infarction, unspecified: Secondary | ICD-10-CM | POA: Diagnosis not present

## 2019-12-29 LAB — CUP PACEART REMOTE DEVICE CHECK
Date Time Interrogation Session: 20210128021032
Implantable Pulse Generator Implant Date: 20200612

## 2019-12-29 NOTE — Progress Notes (Signed)
ILR Remote 

## 2020-01-29 ENCOUNTER — Ambulatory Visit (INDEPENDENT_AMBULATORY_CARE_PROVIDER_SITE_OTHER): Payer: BC Managed Care – PPO | Admitting: *Deleted

## 2020-01-29 DIAGNOSIS — I639 Cerebral infarction, unspecified: Secondary | ICD-10-CM | POA: Diagnosis not present

## 2020-01-29 LAB — CUP PACEART REMOTE DEVICE CHECK
Date Time Interrogation Session: 20210228020932
Implantable Pulse Generator Implant Date: 20200612

## 2020-01-29 NOTE — Progress Notes (Signed)
ILR remote 

## 2020-01-31 ENCOUNTER — Ambulatory Visit: Payer: Self-pay

## 2020-02-14 IMAGING — DX CHEST - 2 VIEW
2 series · 2 of 2 positions shown · non-contrast
Comparison: Prior radiograph 02/18/2012.

CLINICAL DATA: Initial evaluation for acute stroke.

EXAM:
CHEST - 2 VIEW

[chest pa]
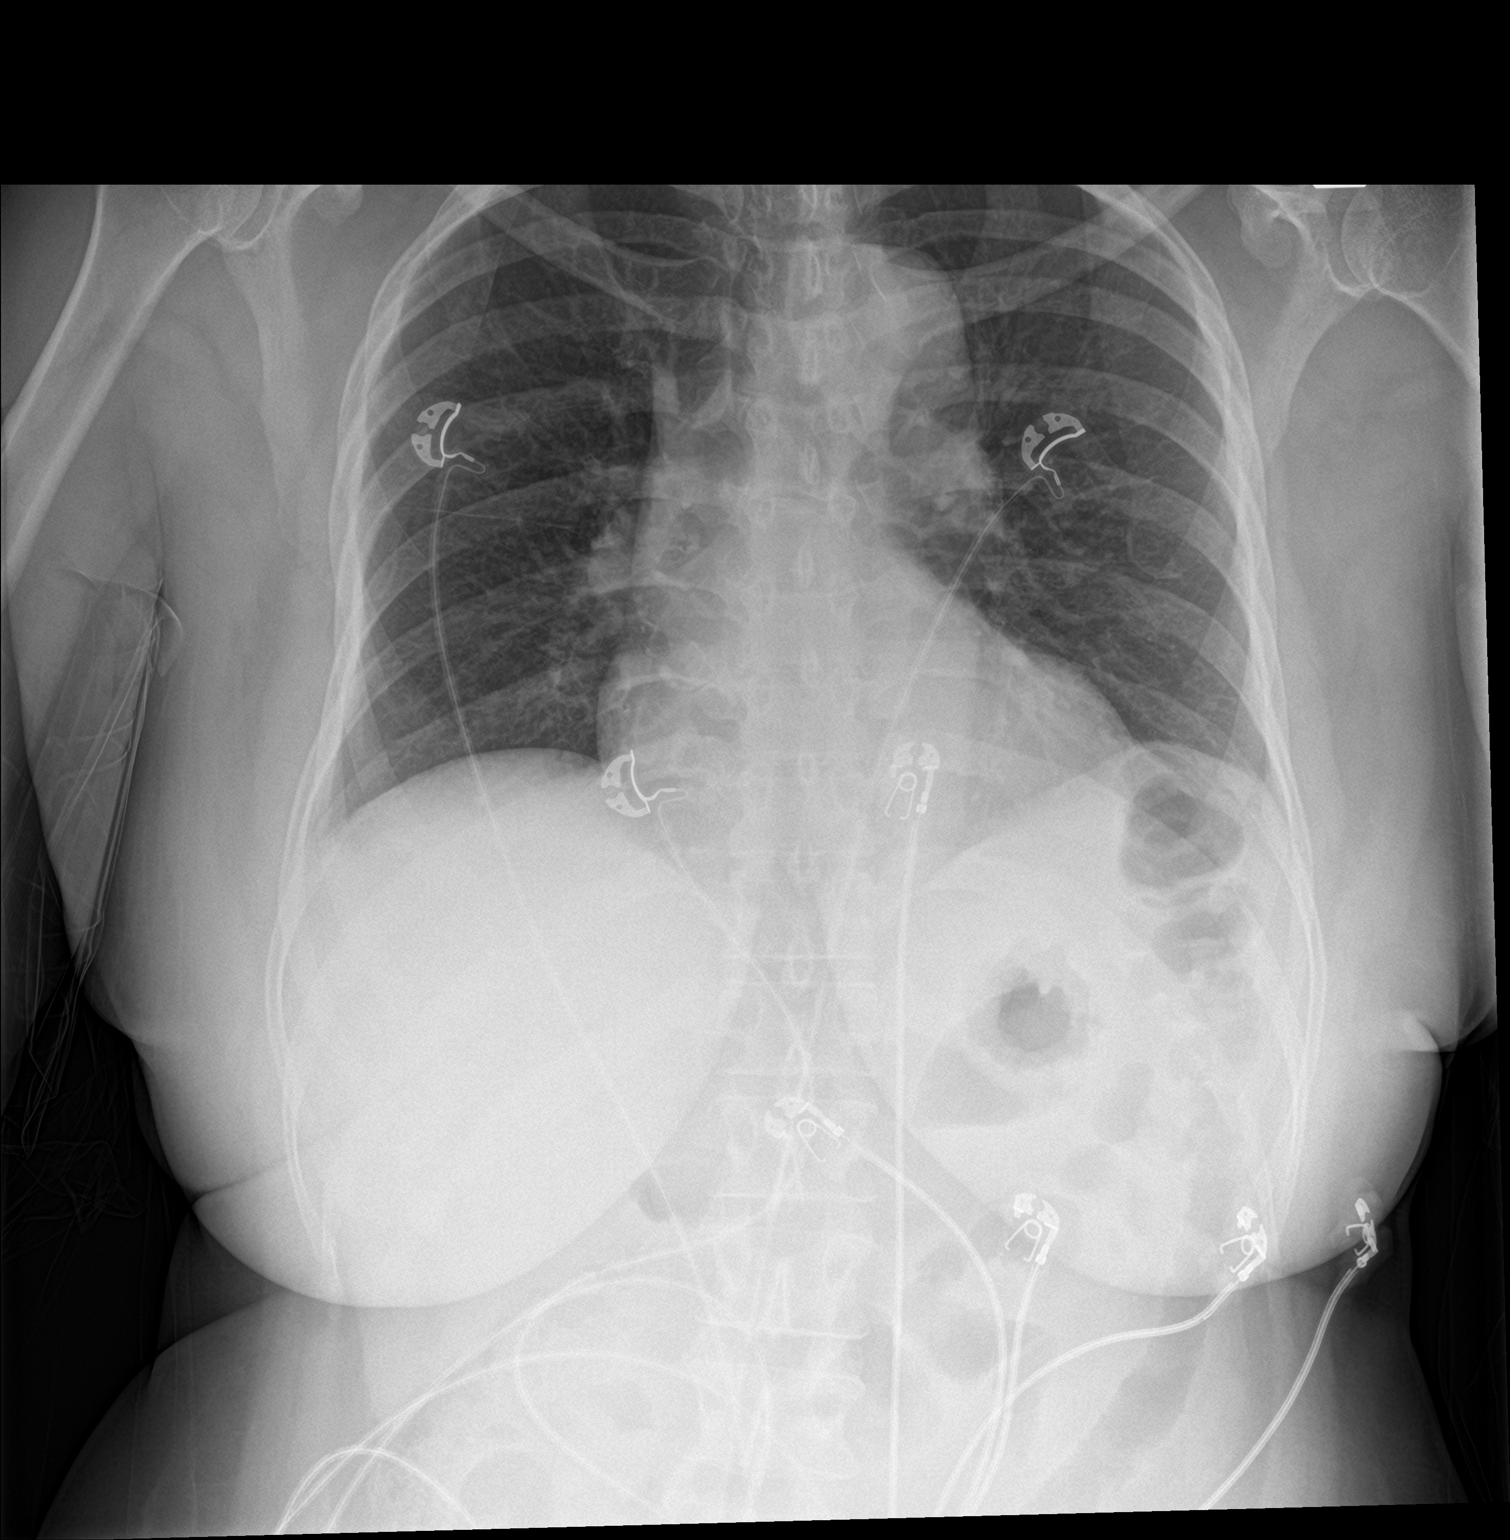

[chest lat]
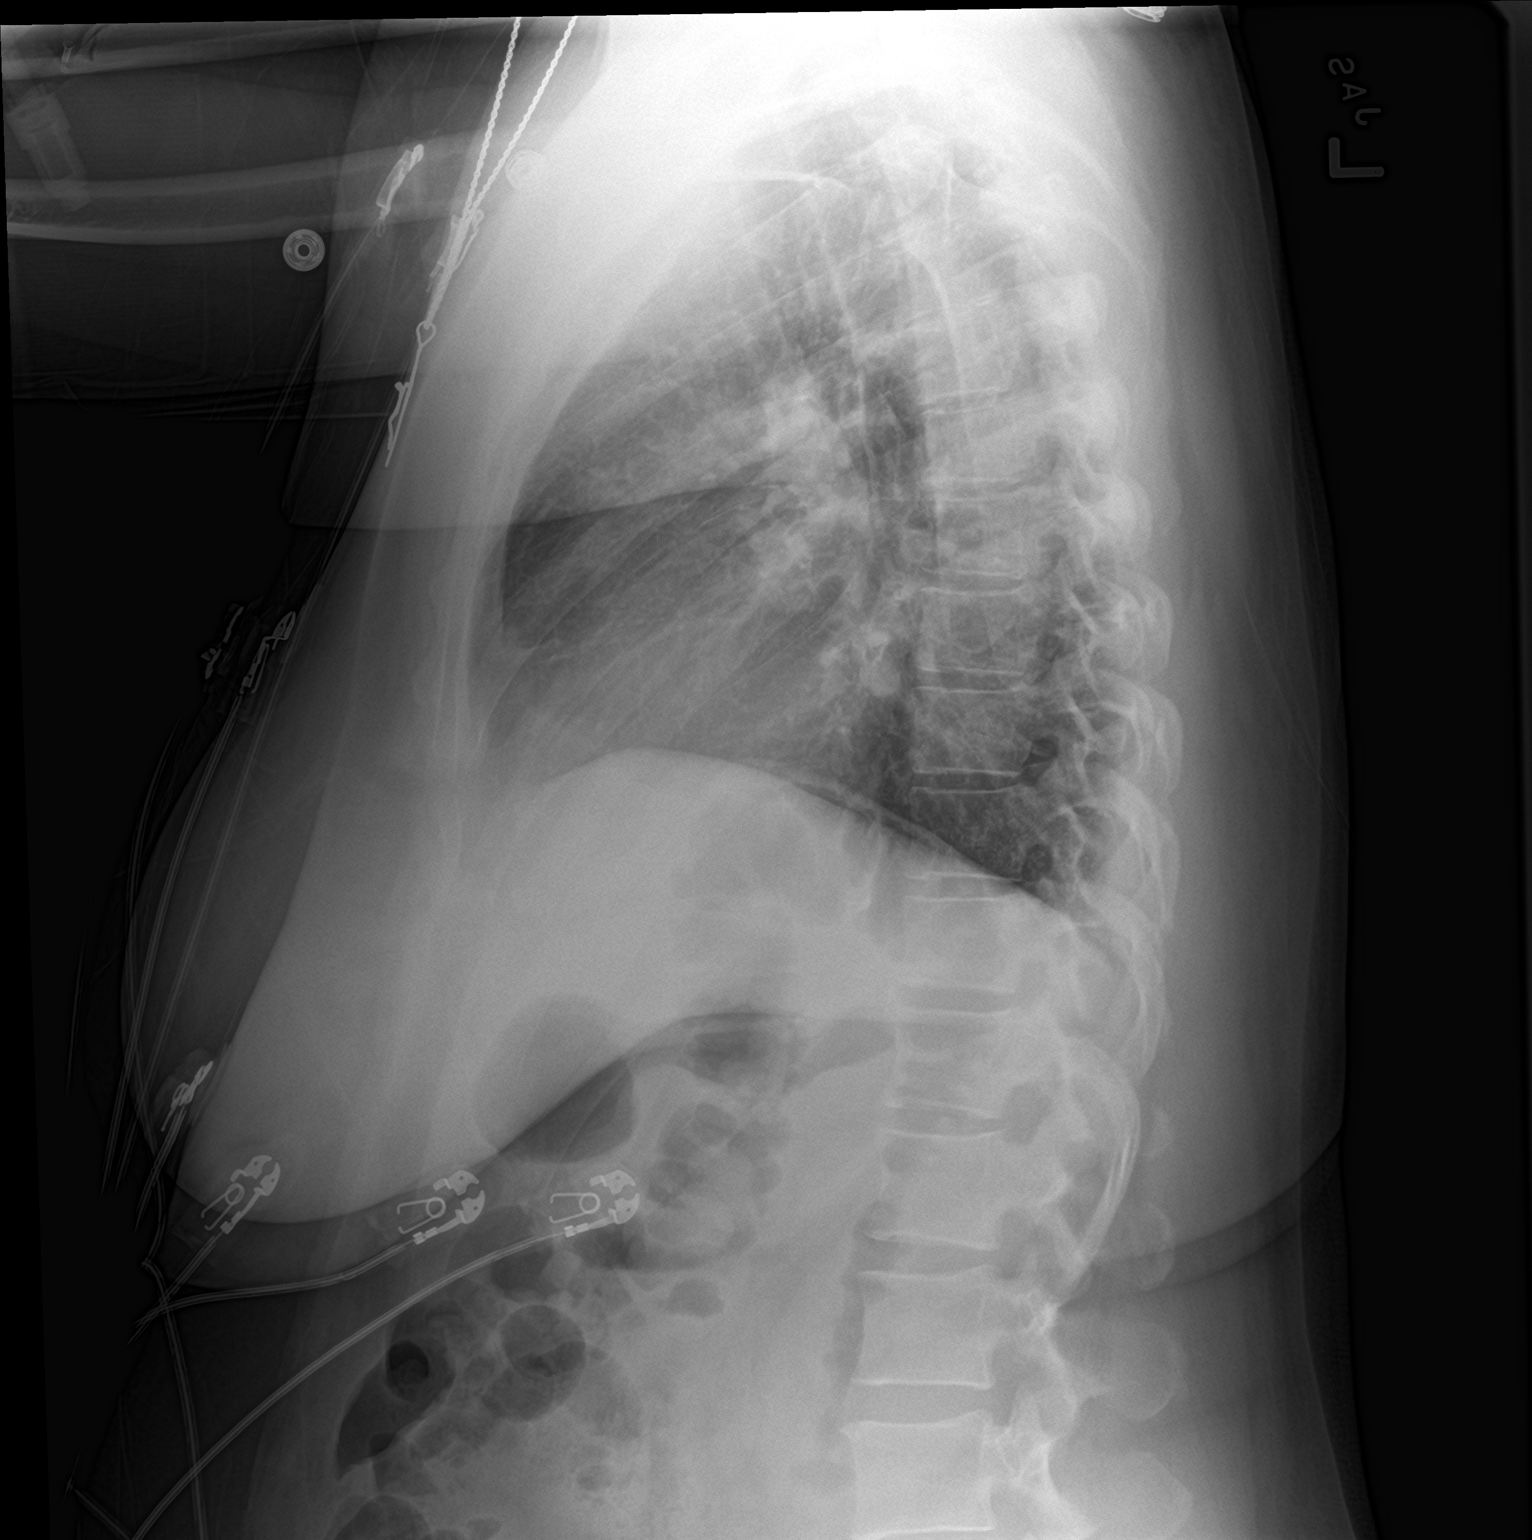

[2 of 2 positions shown; findings below may reference images not displayed]

FINDINGS: The cardiac and mediastinal silhouettes are stable in size and
contour, and remain within normal limits.

The lungs are normally inflated. No airspace consolidation, pleural
effusion, or pulmonary edema is identified. There is no
pneumothorax.

No acute osseous abnormality identified.
IMPRESSION: No active cardiopulmonary disease.

## 2020-02-29 ENCOUNTER — Ambulatory Visit (INDEPENDENT_AMBULATORY_CARE_PROVIDER_SITE_OTHER): Payer: BC Managed Care – PPO | Admitting: *Deleted

## 2020-02-29 DIAGNOSIS — I639 Cerebral infarction, unspecified: Secondary | ICD-10-CM | POA: Diagnosis not present

## 2020-02-29 LAB — CUP PACEART REMOTE DEVICE CHECK
Date Time Interrogation Session: 20210331031301
Implantable Pulse Generator Implant Date: 20200612

## 2020-02-29 NOTE — Progress Notes (Signed)
ILR Remote 

## 2020-03-05 ENCOUNTER — Telehealth: Payer: Self-pay

## 2020-03-05 NOTE — Telephone Encounter (Signed)
I called the pt to let her know her monitor automatically sends. She do not need to send a manual unless a nurse asks her to. No answer/ no voicemail.

## 2020-03-27 ENCOUNTER — Telehealth: Payer: Self-pay

## 2020-03-27 NOTE — Telephone Encounter (Signed)
LMOVM to stop sending manual transmissions with home monitor.

## 2020-03-31 LAB — CUP PACEART REMOTE DEVICE CHECK
Date Time Interrogation Session: 20210501031756
Implantable Pulse Generator Implant Date: 20200612

## 2020-04-02 ENCOUNTER — Ambulatory Visit (INDEPENDENT_AMBULATORY_CARE_PROVIDER_SITE_OTHER): Payer: BC Managed Care – PPO | Admitting: *Deleted

## 2020-04-02 DIAGNOSIS — I639 Cerebral infarction, unspecified: Secondary | ICD-10-CM

## 2020-04-03 NOTE — Progress Notes (Signed)
Carelink Summary Report / Loop Recorder 

## 2020-05-04 LAB — CUP PACEART REMOTE DEVICE CHECK
Date Time Interrogation Session: 20210603232607
Implantable Pulse Generator Implant Date: 20200612

## 2020-06-07 ENCOUNTER — Ambulatory Visit (INDEPENDENT_AMBULATORY_CARE_PROVIDER_SITE_OTHER): Payer: BC Managed Care – PPO | Admitting: *Deleted

## 2020-06-07 DIAGNOSIS — I639 Cerebral infarction, unspecified: Secondary | ICD-10-CM | POA: Diagnosis not present

## 2020-06-07 LAB — CUP PACEART REMOTE DEVICE CHECK
Date Time Interrogation Session: 20210707230832
Implantable Pulse Generator Implant Date: 20200612

## 2020-06-11 NOTE — Progress Notes (Signed)
Carelink Summary Report / Loop Recorder 

## 2020-07-10 ENCOUNTER — Ambulatory Visit (INDEPENDENT_AMBULATORY_CARE_PROVIDER_SITE_OTHER): Payer: BC Managed Care – PPO | Admitting: *Deleted

## 2020-07-10 DIAGNOSIS — I639 Cerebral infarction, unspecified: Secondary | ICD-10-CM | POA: Diagnosis not present

## 2020-07-10 LAB — CUP PACEART REMOTE DEVICE CHECK
Date Time Interrogation Session: 20210809231647
Implantable Pulse Generator Implant Date: 20200612

## 2020-07-12 NOTE — Progress Notes (Signed)
Carelink Summary Report / Loop Recorder 

## 2020-08-12 ENCOUNTER — Ambulatory Visit: Payer: BC Managed Care – PPO

## 2020-08-13 LAB — CUP PACEART REMOTE DEVICE CHECK
Date Time Interrogation Session: 20210911232649
Implantable Pulse Generator Implant Date: 20200612

## 2020-09-11 ENCOUNTER — Other Ambulatory Visit (HOSPITAL_COMMUNITY): Payer: Self-pay | Admitting: Physician Assistant

## 2020-09-11 ENCOUNTER — Other Ambulatory Visit: Payer: Self-pay

## 2020-09-11 ENCOUNTER — Ambulatory Visit (HOSPITAL_COMMUNITY)
Admission: RE | Admit: 2020-09-11 | Discharge: 2020-09-11 | Disposition: A | Discharge status: Home / Self Care | Payer: BC Managed Care – PPO | Source: Ambulatory Visit | Attending: Physician Assistant | Admitting: Physician Assistant

## 2020-09-11 DIAGNOSIS — M7989 Other specified soft tissue disorders: Secondary | ICD-10-CM | POA: Insufficient documentation

## 2020-09-14 ENCOUNTER — Ambulatory Visit (INDEPENDENT_AMBULATORY_CARE_PROVIDER_SITE_OTHER): Payer: BC Managed Care – PPO

## 2020-09-14 DIAGNOSIS — I639 Cerebral infarction, unspecified: Secondary | ICD-10-CM

## 2020-09-15 LAB — CUP PACEART REMOTE DEVICE CHECK
Date Time Interrogation Session: 20211014233523
Implantable Pulse Generator Implant Date: 20200612

## 2020-09-19 NOTE — Progress Notes (Signed)
Carelink Summary Report / Loop Recorder 

## 2020-10-17 ENCOUNTER — Ambulatory Visit (INDEPENDENT_AMBULATORY_CARE_PROVIDER_SITE_OTHER): Payer: BC Managed Care – PPO

## 2020-10-17 DIAGNOSIS — I639 Cerebral infarction, unspecified: Secondary | ICD-10-CM | POA: Diagnosis not present

## 2020-10-17 LAB — CUP PACEART REMOTE DEVICE CHECK
Date Time Interrogation Session: 20211116225121
Implantable Pulse Generator Implant Date: 20200612

## 2020-10-19 NOTE — Progress Notes (Signed)
Carelink Summary Report / Loop Recorder 

## 2020-10-23 ENCOUNTER — Telehealth: Payer: Self-pay | Admitting: Orthopedic Surgery

## 2020-10-23 NOTE — Telephone Encounter (Signed)
I left a message for patient to call back about an appointment change for another day due to Dr. Dallas Schimke schedule changing. Waiting on a call back.

## 2020-10-23 NOTE — Telephone Encounter (Signed)
Patient coming in at an earlier time. No other concerns.

## 2020-10-24 ENCOUNTER — Other Ambulatory Visit: Payer: Self-pay

## 2020-10-24 ENCOUNTER — Encounter: Payer: Self-pay | Admitting: Orthopedic Surgery

## 2020-10-24 ENCOUNTER — Ambulatory Visit (INDEPENDENT_AMBULATORY_CARE_PROVIDER_SITE_OTHER): Payer: BC Managed Care – PPO | Admitting: Orthopedic Surgery

## 2020-10-24 VITALS — BP 125/90 | HR 88 | Ht 62.0 in | Wt 175.0 lb

## 2020-10-24 DIAGNOSIS — M67479 Ganglion, unspecified ankle and foot: Secondary | ICD-10-CM

## 2020-10-24 NOTE — Progress Notes (Signed)
New Patient Visit  Assessment: Carolyn Mclaughlin is a 49 y.o. female with the following: 1. Ganglion cyst of foot  Plan: Carolyn Mclaughlin has a painless ganglion cyst on her left foot.  This is located in the dorsal aspect, just anterior to the ankle joint.  She is not exhibiting any symptoms of nerve or vasculature compression.  Her ankle is not currently tender.  It does not interfere with her daily activities.  We discussed the pathology, and explained to her that there is nothing further that needs to be done, unless it becomes painful.  Similarly, if she notices a significant change in the size or any concerning changes, she should return to clinic for repeat evaluation.  We also discussed the possibility of proceeding with an aspiration versus surgical excision.  She is not interested at this time.  No follow-up is needed.  She will contact clinic if she has any issues in the future.  Follow-up: Return if symptoms worsen or fail to improve.  Subjective:  Chief Complaint  Patient presents with  . Ankle Pain    left ankle pain, 2 months after coming back from [place] having some swelling    History of Present Illness: Carolyn Mclaughlin is a 49 y.o. female who has been referred to clinic by Carolyn Herald, PA-C for evaluation of left ankle pain.  She first noticed some pain and swelling in her left ankle approximately 2 months ago.  The pain gradually improved, but she continues to have some swelling.  At this point in time, it is not interfering with any of her activities.  She is not having any pain.  She denies any numbness or tingling distally.  Her health is otherwise remained stable.   Review of Systems: No fevers or chills No numbness or tingling No chest pain No shortness of breath No bowel or bladder dysfunction No GI distress No headaches   Medical History:  Past Medical History:  Diagnosis Date  . Headache   . Incarcerated epigastric hernia 04/26/2019  .  Umbilical hernia 04/26/2019    Past Surgical History:  Procedure Laterality Date  . BUBBLE STUDY  05/13/2019   Procedure: BUBBLE STUDY;  Surgeon: Parke Poisson, MD;  Location: [facility];  Service: Cardiology;;  . LOOP RECORDER INSERTION Left 05/13/2019   Procedure: LOOP RECORDER INSERTION;  Surgeon: Regan Lemming, MD;  Location: [facility];  Service: Cardiovascular;  Laterality: Left;  . NO PAST SURGERIES    . TEE WITHOUT CARDIOVERSION N/A 05/13/2019   Procedure: TRANSESOPHAGEAL ECHOCARDIOGRAM (TEE);  Surgeon: Parke Poisson, MD;  Location: [facility]COPY;  Service: Cardiology;  Laterality: N/A;  . VENTRAL HERNIA REPAIR N/A 04/26/2019   Procedure: LAPRASCOPIC REPAIR OF INCARCERATED EPIGASTRIC HERNIA WITH MESH;  Surgeon: Claud Kelp, MD;  Location[facility];  Service: General;  Laterality: N/A;    Family History  Family history unknown: Yes   Social History   Tobacco Use  . Smoking status: Never Smoker  . Smokeless tobacco: Never Used  Vaping Use  . Vaping Use: Never used  Substance Use Topics  . Alcohol use: Yes    Comment: socially-wine  . Drug use: Never    No Known Allergies  Current Meds  Medication Sig  . atorvastatin (LIPITOR) 10 MG tablet Take 10 mg by mouth daily.  . hydrochlorothiazide (HYDRODIURIL) 25 MG tablet Take 25 mg by mouth every morning.  . [DISCONTINUED] atorvastatin (LIPITOR) 80 MG tablet Take 1 tablet (80 mg total) by  mouth at bedtime.    Objective: BP 125/90   Pulse 88   Ht 5\' 2"  (1.575 m)   Wt 175 lb (79.4 kg)   BMI 32.01 kg/m   Physical Exam:  General: Alert and oriented, no acute distress Gait: Normal gait  Evaluation of the left ankle demonstrates no obvious swelling.  There is a small mass in the anterior aspect of the ankle, just distal to the tibiotalar joint.  This is firm and rubbery.  It can be compressed slightly.  There is no overlying skin changes.  It is not tender to palpation.  Negative Tinel's in this  area. She is able to achieve neutral position of the ankle.   IMAGING: I personally reviewed images previously obtained from the ED   X-rays of the left ankle were reviewed in clinic today and demonstrate no acute abnormality.  Overall alignment is normal.  The anterior aspect of the talus there is a small bony defect, which looks chronic.   New Medications:  No orders of the defined types were placed in this encounter.     , MD  10/24/2020 11:22 AM

## 2020-11-08 ENCOUNTER — Ambulatory Visit: Payer: BC Managed Care – PPO

## 2020-11-19 ENCOUNTER — Ambulatory Visit (INDEPENDENT_AMBULATORY_CARE_PROVIDER_SITE_OTHER): Payer: BC Managed Care – PPO

## 2020-11-19 DIAGNOSIS — I639 Cerebral infarction, unspecified: Secondary | ICD-10-CM

## 2020-11-20 LAB — CUP PACEART REMOTE DEVICE CHECK
Date Time Interrogation Session: 20211219225123
Implantable Pulse Generator Implant Date: 20200612

## 2020-12-03 NOTE — Progress Notes (Signed)
Carelink Summary Report / Loop Recorder 

## 2020-12-06 ENCOUNTER — Ambulatory Visit: Payer: Self-pay | Attending: Internal Medicine

## 2020-12-06 DIAGNOSIS — Z23 Encounter for immunization: Secondary | ICD-10-CM

## 2020-12-06 NOTE — Progress Notes (Signed)
   Covid-19 Vaccination Clinic  Name:  Carolyn Mclaughlin    MRN: [id number] DOB: 05-26-1971  12/06/2020  Carolyn Mclaughlin was observed post Covid-19 immunization for 15 minutes without incident. She was provided with Vaccine Information Sheet and instruction to access the V-Safe system.   Carolyn Mclaughlin was instructed to call 911 with any severe reactions post vaccine: [name] Kitchen Difficulty breathing  . Swelling of face and throat  . A fast heartbeat  . A bad rash all over body  . Dizziness and weakness   Immunizations Administered    Name Date Dose VIS Date Route   Pfizer COVID-19 Vaccine 12/06/2020  3:14 PM 0.3 mL 09/19/2020 Intramuscular   Manufacturer: [employer], [employer]   Lot: [id number]   NDC: [address]-9

## 2020-12-23 LAB — CUP PACEART REMOTE DEVICE CHECK
Date Time Interrogation Session: 20220121230425
Implantable Pulse Generator Implant Date: 20200612

## 2020-12-24 ENCOUNTER — Ambulatory Visit (INDEPENDENT_AMBULATORY_CARE_PROVIDER_SITE_OTHER): Payer: BC Managed Care – PPO

## 2020-12-24 DIAGNOSIS — I639 Cerebral infarction, unspecified: Secondary | ICD-10-CM | POA: Diagnosis not present

## 2021-01-03 NOTE — Progress Notes (Signed)
Carelink Summary Report / Loop Recorder 

## 2021-01-26 LAB — CUP PACEART REMOTE DEVICE CHECK
Date Time Interrogation Session: 20220223230548
Implantable Pulse Generator Implant Date: 20200612

## 2021-01-28 ENCOUNTER — Ambulatory Visit (INDEPENDENT_AMBULATORY_CARE_PROVIDER_SITE_OTHER): Payer: BC Managed Care – PPO

## 2021-01-28 DIAGNOSIS — I639 Cerebral infarction, unspecified: Secondary | ICD-10-CM

## 2021-02-05 NOTE — Progress Notes (Signed)
Carelink Summary Report / Loop Recorder 

## 2021-02-26 ENCOUNTER — Ambulatory Visit (INDEPENDENT_AMBULATORY_CARE_PROVIDER_SITE_OTHER): Payer: BC Managed Care – PPO

## 2021-02-26 DIAGNOSIS — I639 Cerebral infarction, unspecified: Secondary | ICD-10-CM

## 2021-02-26 LAB — CUP PACEART REMOTE DEVICE CHECK
Date Time Interrogation Session: 20220329001806
Implantable Pulse Generator Implant Date: 20200612

## 2021-03-12 NOTE — Progress Notes (Signed)
Carelink Summary Report / Loop Recorder 

## 2021-03-15 NOTE — Addendum Note (Signed)
Addended by: Charise Leinbach D on: 03/15/2021 05:57 PM   Modules accepted: Level of Service  

## 2021-04-01 ENCOUNTER — Ambulatory Visit (INDEPENDENT_AMBULATORY_CARE_PROVIDER_SITE_OTHER): Payer: BC Managed Care – PPO

## 2021-04-01 DIAGNOSIS — I639 Cerebral infarction, unspecified: Secondary | ICD-10-CM

## 2021-04-02 LAB — CUP PACEART REMOTE DEVICE CHECK
Date Time Interrogation Session: 20220501002000
Implantable Pulse Generator Implant Date: 20200612

## 2021-04-23 NOTE — Progress Notes (Signed)
Carelink Summary Report / Loop Recorder 

## 2021-05-05 LAB — CUP PACEART REMOTE DEVICE CHECK
Date Time Interrogation Session: 20220603002413
Implantable Pulse Generator Implant Date: 20200612

## 2021-05-06 ENCOUNTER — Ambulatory Visit (INDEPENDENT_AMBULATORY_CARE_PROVIDER_SITE_OTHER): Payer: BC Managed Care – PPO

## 2021-05-06 DIAGNOSIS — I639 Cerebral infarction, unspecified: Secondary | ICD-10-CM | POA: Diagnosis not present

## 2021-05-23 ENCOUNTER — Telehealth: Payer: Self-pay

## 2021-05-23 NOTE — Telephone Encounter (Signed)
Referral notes sent from [facility], Phone #: [phone], Fax #: [phone]   Notes sent to scheduling

## 2021-05-28 NOTE — Progress Notes (Signed)
Carelink Summary Report / Loop Recorder 

## 2021-06-10 ENCOUNTER — Ambulatory Visit (INDEPENDENT_AMBULATORY_CARE_PROVIDER_SITE_OTHER): Payer: BC Managed Care – PPO

## 2021-06-10 DIAGNOSIS — I639 Cerebral infarction, unspecified: Secondary | ICD-10-CM | POA: Diagnosis not present

## 2021-06-10 LAB — CUP PACEART REMOTE DEVICE CHECK
Date Time Interrogation Session: 20220706004306
Implantable Pulse Generator Implant Date: 20200612

## 2021-07-03 NOTE — Progress Notes (Signed)
Carelink Summary Report / Loop Recorder 

## 2021-07-11 ENCOUNTER — Other Ambulatory Visit: Payer: Self-pay

## 2021-07-11 ENCOUNTER — Ambulatory Visit: Payer: BC Managed Care – PPO | Admitting: Internal Medicine

## 2021-07-11 VITALS — BP 120/76 | HR 82 | Ht 62.0 in | Wt 185.0 lb

## 2021-07-11 DIAGNOSIS — R748 Abnormal levels of other serum enzymes: Secondary | ICD-10-CM | POA: Diagnosis not present

## 2021-07-11 DIAGNOSIS — I639 Cerebral infarction, unspecified: Secondary | ICD-10-CM | POA: Diagnosis not present

## 2021-07-11 MED ORDER — ASPIRIN EC 81 MG PO TBEC: 81.0000 mg | DELAYED_RELEASE_TABLET | Freq: Every day | ORAL | 3 refills | Status: DC

## 2021-07-11 MED ORDER — ROSUVASTATIN CALCIUM 10 MG PO TABS: 10.0000 mg | ORAL_TABLET | Freq: Every day | ORAL | 3 refills | Status: DC

## 2021-07-11 NOTE — Patient Instructions (Signed)
Medication Instructions:  Your physician has recommended you make the following change in your medication:  START Rosuvastatin 10 mg tablets daily START Asprin 81 mg tablets daily    *If you need a refill on your cardiac medications before your next appointment, please call your pharmacy*   Lab Work: Lipid -AST If you have labs (blood work) drawn today and your tests are completely normal, you will receive your results only by: MyChart Message (if you have MyChart) OR A paper copy in the mail If you have any lab test that is abnormal or we need to change your treatment, we will call you to review the results.   Testing/Procedures: None   Follow-Up: At [facility], you and your health needs are our priority.  As part of our continuing mission to provide you with exceptional heart care, we have created designated Provider Care Teams.  These Care Teams include your primary Cardiologist (physician) and Advanced Practice Providers (APPs -  Physician Assistants and Nurse Practitioners) who all work together to provide you with the care you need, when you need it.  We recommend signing up for the patient portal called "MyChart".  Sign up information is provided on this After Visit Summary.  MyChart is used to connect with patients for Virtual Visits (Telemedicine).  Patients are able to view lab/test results, encounter notes, upcoming appointments, etc.  Non-urgent messages can be sent to your provider as well.   To learn more about what you can do with MyChart, go to [email or web address].    Your next appointment:   June 2023 with Dietrich Pates, MD   Other Instructions

## 2021-07-11 NOTE — Progress Notes (Signed)
 Cardiology Office Note   Date:  07/11/2021   ID:  Carolyn Mclaughlin, DOB 07/06/1971, MRN [id number]  PCP:  Mann, Benjamin L, PA-C  Cardiologist:   Paula Ross, MD       History of Present Illness: Carolyn Mclaughlin is a 50 y.o. female with a history of L thalamic CVA in 2020   Felt to be due to small vessel dz   Pt also found to have a PFO at time      LVEF normal   A LINQ was placed   She has not been seen since (note ESR elevate 52 and ANA positive)  Since seen she she  has done OK  She is no longer on ASA or lipitor Breathing is OK  She denies CP  No weakness/ numbness or focal neurologic complaints         Current Meds  Medication Sig   amLODipine (NORVASC) 2.5 MG tablet Take 2.5 mg by mouth daily.   hydrochlorothiazide (HYDRODIURIL) 25 MG tablet Take 25 mg by mouth every morning.     Allergies:   Patient has no known allergies.   Past Medical History:  Diagnosis Date   Headache    Incarcerated epigastric hernia 04/26/2019   Umbilical hernia 04/26/2019    Past Surgical History:  Procedure Laterality Date   BUBBLE STUDY  05/13/2019   Procedure: BUBBLE STUDY;  Surgeon: Acharya, Gayatri A, MD;  Location: [facility];  Service: Cardiology;;   LOOP RECORDER INSERTION Left 05/13/2019   Procedure: LOOP RECORDER INSERTION;  Surgeon: Camnitz, Will Martin, MD;  Location: [facility];  Service: Cardiovascular;  Laterality: Left;   NO PAST SURGERIES     TEE WITHOUT CARDIOVERSION N/A 05/13/2019   Procedure: TRANSESOPHAGEAL ECHOCARDIOGRAM (TEE);  Surgeon: Acharya, Gayatri A, MD;  Location: [facility];  Service: Cardiology;  Laterality: N/A;   VENTRAL HERNIA REPAIR N/A 04/26/2019   Procedure: LAPRASCOPIC REPAIR OF INCARCERATED EPIGASTRIC HERNIA WITH MESH;  Surgeon: Ingram, Haywood, MD;  Location: [facility];  Service: General;  Laterality: N/A;     Social History:  The patient  reports that she has never smoked. She has never used smokeless tobacco. She  reports current alcohol use. She reports that she does not use drugs.   Family History:  The patient's Family history is unknown by patient.    ROS:  Please see the history of present illness. All other systems are reviewed and  Negative to the above problem except as noted.    PHYSICAL EXAM: VS:  BP 120/76 (BP Location: Right Arm)   Pulse 82   Ht 5' 2" (1.575 m)   Wt 185 lb (83.9 kg)   SpO2 100%   BMI 33.84 kg/m   GEN: Obese 50 yo, in no acute distress  HEENT: normal  Neck: no JVD, carotid bruits,  Cardiac: RRR; no murmurs,  No LE edema  Respiratory:  clear to auscultation bilaterally,  GI: soft, nontender, nondistended, + BS  No hepatomegaly  MS: no deformity Moving all extremities   Skin: warm and dry, no rash Neuro:  Strength and sensation are intact Psych: euthymic mood, full affect   EKG:  EKG is ordered today.  SR   75 bpm     Lipid Panel    Component Value Date/Time   CHOL 114 06/21/2019 1116   TRIG 59 06/21/2019 1116   HDL 56 06/21/2019 1116   CHOLHDL 2.0 06/21/2019 1116   CHOLHDL 3.8 05/10/2019 0341   VLDL 10   05/10/2019 0341   LDLCALC 46 06/21/2019 1116      Wt Readings from Last 3 Encounters:  07/11/21 185 lb (83.9 kg)  10/24/20 175 lb (79.4 kg)  06/21/19 180 lb 6.4 oz (81.8 kg)      ASSESSMENT AND PLAN:  1  Hx of CVA  2020   Per neuro felt to be due to small vessel dz    Also found to have PFO though    Recomm get back on aspirin Resume statin     Has LINQ in place   Will confer with Device clinic re afib detection    2  HTN  BP is controlled on current regimen  Continue  3  HL   Off of a statin    I would recomm Crestor 10 mg   F/U in 2 months with liver function tests  4 Obesity     Discussed diet, limiting sweets  F/u next summer     Current medicines are reviewed at length with the patient today.  The patient does not have concerns regarding medicines.  Signed, Dorris Carnes, MD  07/11/2021 1:50 PM    [facility]  [facility] [address], [place], [place]  [address] Phone: [phone]; Fax: [phone]

## 2021-07-12 ENCOUNTER — Telehealth: Payer: Self-pay

## 2021-07-12 NOTE — Telephone Encounter (Signed)
Dr. Tenny Craw would like a manual transmission to see if any AF showed on ILR1. Attempted to contact patient to send.   No answer, LMTCB.

## 2021-07-12 NOTE — Telephone Encounter (Signed)
-----   Message from [name], MD sent at 07/11/2021 11:24 PM EDT ----- Confirming Carolyn Mclaughlin shows no afib

## 2021-07-15 NOTE — Telephone Encounter (Signed)
I spoke with the patient and she is at work until 5:30 pm.  I told her I will fax her a copy of the instructions to send the transmission. She states she will do it when she get home. She also asked if it did not work can she call someone to help. I told her the office close at 5 pm but I will make sure [employer] tech support number is on the fax so she can get help with the monitor. The patient verbalized understanding.

## 2021-07-16 NOTE — Telephone Encounter (Signed)
Manual transmission from ILR received 8/15.  Report shows no arrhythmias.

## 2021-07-16 NOTE — Telephone Encounter (Signed)
Transmission received 07/15/2021.

## 2021-07-16 NOTE — Telephone Encounter (Signed)
No arrhythmias noted   Continue to follow

## 2021-08-14 LAB — CUP PACEART REMOTE DEVICE CHECK
Date Time Interrogation Session: 20220910014329
Implantable Pulse Generator Implant Date: 20200612

## 2021-08-19 ENCOUNTER — Ambulatory Visit (INDEPENDENT_AMBULATORY_CARE_PROVIDER_SITE_OTHER): Payer: BC Managed Care – PPO

## 2021-08-19 DIAGNOSIS — I639 Cerebral infarction, unspecified: Secondary | ICD-10-CM | POA: Diagnosis not present

## 2021-08-26 NOTE — Progress Notes (Signed)
Carelink Summary Report / Loop Recorder 

## 2021-09-16 ENCOUNTER — Ambulatory Visit (INDEPENDENT_AMBULATORY_CARE_PROVIDER_SITE_OTHER): Payer: BC Managed Care – PPO

## 2021-09-16 DIAGNOSIS — I639 Cerebral infarction, unspecified: Secondary | ICD-10-CM

## 2021-09-18 LAB — CUP PACEART REMOTE DEVICE CHECK
Date Time Interrogation Session: 20221013014308
Implantable Pulse Generator Implant Date: 20200612

## 2021-09-25 NOTE — Progress Notes (Signed)
Carelink Summary Report / Loop Recorder 

## 2021-10-01 NOTE — Addendum Note (Signed)
Addended by: Geralyn Flash D on: 10/01/2021 01:10 PM   Modules accepted: Level of Service

## 2021-10-16 LAB — CUP PACEART REMOTE DEVICE CHECK
Date Time Interrogation Session: 20221115011606
Implantable Pulse Generator Implant Date: 20200612

## 2021-10-21 ENCOUNTER — Ambulatory Visit (INDEPENDENT_AMBULATORY_CARE_PROVIDER_SITE_OTHER): Payer: BC Managed Care – PPO

## 2021-10-21 DIAGNOSIS — I639 Cerebral infarction, unspecified: Secondary | ICD-10-CM | POA: Diagnosis not present

## 2021-10-31 NOTE — Progress Notes (Signed)
Carelink Summary Report / Loop Recorder 

## 2021-11-19 LAB — CUP PACEART REMOTE DEVICE CHECK
Date Time Interrogation Session: 20221218011844
Implantable Pulse Generator Implant Date: 20200612

## 2021-11-27 ENCOUNTER — Ambulatory Visit (INDEPENDENT_AMBULATORY_CARE_PROVIDER_SITE_OTHER): Payer: BC Managed Care – PPO

## 2021-11-27 DIAGNOSIS — I639 Cerebral infarction, unspecified: Secondary | ICD-10-CM | POA: Diagnosis not present

## 2021-12-10 NOTE — Progress Notes (Signed)
Carelink Summary Report / Loop Recorder 

## 2021-12-30 ENCOUNTER — Ambulatory Visit (INDEPENDENT_AMBULATORY_CARE_PROVIDER_SITE_OTHER): Payer: BC Managed Care – PPO

## 2021-12-30 DIAGNOSIS — I639 Cerebral infarction, unspecified: Secondary | ICD-10-CM

## 2021-12-30 LAB — CUP PACEART REMOTE DEVICE CHECK
Date Time Interrogation Session: 20230129232402
Implantable Pulse Generator Implant Date: 20200612

## 2022-01-07 NOTE — Progress Notes (Signed)
Carelink Summary Report / Loop Recorder 

## 2022-02-03 ENCOUNTER — Ambulatory Visit (INDEPENDENT_AMBULATORY_CARE_PROVIDER_SITE_OTHER): Payer: BC Managed Care – PPO

## 2022-02-03 DIAGNOSIS — I639 Cerebral infarction, unspecified: Secondary | ICD-10-CM

## 2022-02-04 LAB — CUP PACEART REMOTE DEVICE CHECK
Date Time Interrogation Session: 20230303232730
Implantable Pulse Generator Implant Date: 20200612

## 2022-02-17 NOTE — Progress Notes (Signed)
Carelink Summary Report / Loop Recorder 

## 2022-02-28 ENCOUNTER — Other Ambulatory Visit: Payer: Self-pay | Admitting: *Deleted

## 2022-02-28 ENCOUNTER — Ambulatory Visit: Payer: BC Managed Care – PPO | Admitting: Internal Medicine

## 2022-02-28 ENCOUNTER — Encounter: Payer: Self-pay | Admitting: Internal Medicine

## 2022-02-28 VITALS — BP 118/82 | HR 72 | Resp 18 | Ht 62.0 in | Wt 186.2 lb

## 2022-02-28 DIAGNOSIS — I10 Essential (primary) hypertension: Secondary | ICD-10-CM | POA: Diagnosis not present

## 2022-02-28 DIAGNOSIS — Z1231 Encounter for screening mammogram for malignant neoplasm of breast: Secondary | ICD-10-CM | POA: Diagnosis not present

## 2022-02-28 DIAGNOSIS — Z8673 Personal history of transient ischemic attack (TIA), and cerebral infarction without residual deficits: Secondary | ICD-10-CM

## 2022-02-28 DIAGNOSIS — Z1159 Encounter for screening for other viral diseases: Secondary | ICD-10-CM

## 2022-02-28 DIAGNOSIS — E559 Vitamin D deficiency, unspecified: Secondary | ICD-10-CM

## 2022-02-28 DIAGNOSIS — R42 Dizziness and giddiness: Secondary | ICD-10-CM | POA: Diagnosis not present

## 2022-02-28 DIAGNOSIS — R413 Other amnesia: Secondary | ICD-10-CM

## 2022-02-28 MED ORDER — MECLIZINE HCL 25 MG PO TABS: 25.0000 mg | ORAL_TABLET | Freq: Three times a day (TID) | ORAL | 0 refills | Status: AC | PRN

## 2022-02-28 MED ORDER — HYDROCHLOROTHIAZIDE 25 MG PO TABS: 25.0000 mg | ORAL_TABLET | Freq: Every morning | ORAL | 1 refills | Status: DC

## 2022-02-28 MED ORDER — AMLODIPINE BESYLATE 2.5 MG PO TABS: 2.5000 mg | ORAL_TABLET | Freq: Every day | ORAL | 1 refills | Status: AC

## 2022-02-28 NOTE — Progress Notes (Addendum)
? ?New Patient Office Visit ? ?Subjective:  ?Patient ID: Carolyn Mclaughlin, female    DOB: 1971-07-02  Age: 51 y.o. MRN: [id number] ? ?CC:  ?Chief Complaint  ?Patient presents with  ? New Patient (Initial Visit)  ?  New patient was being seen at [facility] no issues just establishing care   ? ? ?HPI ?Carolyn Mclaughlin is a 51 y.o F  with PMH of acute ischemic stroke and memory loss. The patient presents today for established care and denies headaches and numbness. The patient requested weight loss pills due to weight gain. The patient states that she hasn't been as physically active as she would like due to the cold weather but is more active when the weather gets warmer.  ?[name] shares that she has been experiencing positional dizziness recently that resolves spontaneously with sitting still or drinking a cup of water. ? ? ?Past Medical History:  ?Diagnosis Date  ? Headache   ? Incarcerated epigastric hernia 04/26/2019  ? Stroke ([place]) 05/10/2019  ? Umbilical hernia 4/45/1460  ? ? ?Past Surgical History:  ?Procedure Laterality Date  ? BUBBLE STUDY  05/13/2019  ? Procedure: BUBBLE STUDY;  Surgeon: Elouise Munroe, MD;  Location: [facility];  Service: Cardiology;;  ? LOOP RECORDER INSERTION Left 05/13/2019  ? Procedure: LOOP RECORDER INSERTION;  Surgeon: Constance Haw, MD;  Location: [facility];  Service: Cardiovascular;  Laterality: Left;  ? NO PAST SURGERIES    ? TEE WITHOUT CARDIOVERSION N/A 05/13/2019  ? Procedure: TRANSESOPHAGEAL ECHOCARDIOGRAM (TEE);  Surgeon: Elouise Munroe, MD;  Location: [facility];  Service: Cardiology;  Laterality: N/A;  ? VENTRAL HERNIA REPAIR N/A 04/26/2019  ? Procedure: LAPRASCOPIC REPAIR OF INCARCERATED EPIGASTRIC HERNIA WITH MESH;  Surgeon: Fanny Skates, MD;  Location: [facility];  Service: General;  Laterality: N/A;  ? ? ?Family History  ?Family history unknown: Yes  ? ? ?Social History  ? ?Socioeconomic History  ? Marital status: Single  ?  Spouse  name: Not on file  ? Number of children: Not on file  ? Years of education: Not on file  ? Highest education level: Not on file  ?Occupational History  ? Not on file  ?Tobacco Use  ? Smoking status: Never  ? Smokeless tobacco: Never  ?Vaping Use  ? Vaping Use: Never used  ?Substance and Sexual Activity  ? Alcohol use: Yes  ?  Comment: socially-wine  ? Drug use: Never  ? Sexual activity: Not Currently  ?Other Topics Concern  ? Not on file  ?Social History Narrative  ? Not on file  ? ?Social Determinants of Health  ? ?Financial Resource Strain: Not on file  ?Food Insecurity: Not on file  ?Transportation Needs: Not on file  ?Physical Activity: Not on file  ?Stress: Not on file  ?Social Connections: Not on file  ?Intimate Partner Violence: Not on file  ? ? ?ROS ?Review of Systems  ?Constitutional:  Negative for chills and fever.  ?HENT:  Negative for postnasal drip and rhinorrhea.   ?Eyes:  Negative for pain and redness.  ?Respiratory:  Negative for cough and shortness of breath.   ?Cardiovascular:  Negative for leg swelling.  ?Gastrointestinal:  Negative for constipation and diarrhea.  ?Endocrine: Negative for polydipsia, polyphagia and polyuria.  ?Genitourinary:  Negative for dysuria, enuresis, flank pain and frequency.  ?Musculoskeletal:  Negative for back pain, myalgias and neck pain.  ?Skin:  Negative for pallor and rash.  ?Allergic/Immunologic: Negative for environmental allergies and food  allergies.  ?Neurological:  Positive for dizziness. Negative for tremors, weakness and numbness.  ?Psychiatric/Behavioral:  Negative for suicidal ideas. The patient is not nervous/anxious.   ? ?Objective:  ? ?Today's Vitals: BP 118/82 (BP Location: Left Arm, Patient Position: Sitting, Cuff Size: Normal)   Pulse 72   Resp 18   Ht $R'5\' 2"'cF$  (1.575 m)   Wt 186 lb 3.2 oz (84.5 kg)   SpO2 99%   BMI 34.06 kg/m?  ? ?Physical Exam ?Constitutional:   ?   Appearance: She is obese.  ?HENT:  ?   Head: Normocephalic.  ?   Nose: Nose  normal.  ?   Mouth/Throat:  ?   Mouth: Mucous membranes are moist.  ?Eyes:  ?   General: No scleral icterus. ?   Extraocular Movements: Extraocular movements intact.  ?Cardiovascular:  ?   Rate and Rhythm: Normal rate and regular rhythm.  ?   Pulses: Normal pulses.  ?   Heart sounds: Normal heart sounds. No murmur heard. ?Pulmonary:  ?   Effort: No respiratory distress.  ?   Breath sounds: Normal breath sounds. No wheezing.  ?Abdominal:  ?   General: Bowel sounds are normal.  ?   Palpations: Abdomen is soft.  ?   Tenderness: There is no abdominal tenderness.  ?Musculoskeletal:  ?   Cervical back: Normal range of motion. No rigidity or tenderness.  ?   Right lower leg: No edema.  ?   Left lower leg: No edema.  ?Skin: ?   General: Skin is warm.  ?   Findings: No erythema.  ?Neurological:  ?   General: No focal deficit present.  ?   Mental Status: She is alert and oriented to person, place, and time.  ?   Sensory: No sensory deficit.  ?   Motor: No weakness.  ?Psychiatric:     ?   Mood and Affect: Mood normal.     ?   Behavior: Behavior normal.  ? ? ?Assessment & Plan:  ? ?Problem List Items Addressed This Visit   ? ?  ? Cardiovascular and Mediastinum  ? Essential hypertension  ?  BP Readings from Last 1 Encounters:  ?02/28/22 118/82  ?Well-controlled with amlodipine and HCTZ ?Counseled for compliance with the medications ?Advised DASH diet and moderate exercise/walking, at least 150 mins/week ?  ?  ?  ? Other  ? Memory problem  ?  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  ? ?  ?  ? History of CVA (cerebrovascular accident) - Primary  ?  No residual deficit except mild memory deficit (patient reported) ?Advised the patient to be compliant to aspirin, as she has not been taking it ?Continue Crestor for now ?  ?  ? Relevant Orders  ? CBC with Differential/Platelet  ? TSH + free T4  ? CMP14+EGFR  ? HgB A1c  ? Lipid Profile  ? Dizziness  ?  Complains of intermittent dizziness ?Could be due to dehydration ?She is on HCTZ,  and also reports poor p.o. intake at times ?Advised to maintain at least 64 ounces of fluid intake in a day ?Can use meclizine as needed, would help if she has component of BPPV ?  ?  ? ?Other Visit Diagnoses   ? ? Screening mammogram for breast cancer      ? Relevant Orders  ? MM 3D SCREEN BREAST BILATERAL  ? Need for hepatitis C screening test      ? Relevant Orders  ? Hepatitis C  Antibody  ? Vitamin D deficiency      ? Relevant Orders  ? Vitamin D 1,25 dihydroxy  ? ?  ? ? ?Outpatient Encounter Medications as of 02/28/2022  ?Medication Sig  ? meclizine (ANTIVERT) 25 MG tablet Take 1 tablet (25 mg total) by mouth 3 (three) times daily as needed for dizziness.  ? rosuvastatin (CRESTOR) 10 MG tablet Take 1 tablet (10 mg total) by mouth daily.  ? [DISCONTINUED] amLODipine (NORVASC) 2.5 MG tablet Take 2.5 mg by mouth daily.  ? [DISCONTINUED] hydrochlorothiazide (HYDRODIURIL) 25 MG tablet Take 25 mg by mouth every morning.  ? aspirin EC 81 MG tablet Take 1 tablet (81 mg total) by mouth daily. Swallow whole. (Patient not taking: Reported on 02/28/2022)  ? ?No facility-administered encounter medications on file as of 02/28/2022.  ? ? ?Follow-up: 6 months for pap smear ? ?Lindell Spar, MD ? ?

## 2022-02-28 NOTE — Assessment & Plan Note (Signed)
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  ? ?

## 2022-02-28 NOTE — Assessment & Plan Note (Addendum)
No residual deficit except mild memory deficit (patient reported) ?Advised the patient to be compliant to aspirin, as she has not been taking it ?Continue Crestor for now ?

## 2022-02-28 NOTE — Patient Instructions (Addendum)
I appreciate the opportunity to provide you with care for your health and wellness. ? ?  ?Follow up: 55months for pap smear ?  ?No labs : thyroid, blood work, electrolytes, glucose, cholesterol and vit D level ? ?Please pick up your Meclizine prescription for your dizziness ? ?Referrals today-  None ? ?Refills on all meds ?  ? ? ?  ?It was a pleasure to see you and I look forward to continuing to work together on your health and well-being. ?Please do not hesitate to call the office if you need care or have questions about your care. ?  ?Have a wonderful day and week. ?With Gratitude, ?Gilmore Laroche MSN, FNP-BC  ?

## 2022-02-28 NOTE — Assessment & Plan Note (Signed)
BP Readings from Last 1 Encounters:  ?02/28/22 118/82  ? ?Well-controlled with amlodipine and HCTZ ?Counseled for compliance with the medications ?Advised DASH diet and moderate exercise/walking, at least 150 mins/week ?

## 2022-02-28 NOTE — Addendum Note (Signed)
Addended byTrena Platt on: 02/28/2022 12:28 PM ? ? Modules accepted: Level of Service ? ?

## 2022-02-28 NOTE — Assessment & Plan Note (Signed)
Complains of intermittent dizziness ?Could be due to dehydration ?She is on HCTZ, and also reports poor p.o. intake at times ?Advised to maintain at least 64 ounces of fluid intake in a day ?Can use meclizine as needed, would help if she has component of BPPV ?

## 2022-03-10 ENCOUNTER — Ambulatory Visit (INDEPENDENT_AMBULATORY_CARE_PROVIDER_SITE_OTHER): Payer: BC Managed Care – PPO

## 2022-03-10 DIAGNOSIS — I639 Cerebral infarction, unspecified: Secondary | ICD-10-CM

## 2022-03-11 LAB — CMP14+EGFR
ALT: 20 IU/L (ref 0–32)
AST: 22 IU/L (ref 0–40)
Albumin/Globulin Ratio: 1.9 (ref 1.2–2.2)
Albumin: 4.9 g/dL (ref 3.8–4.9)
Alkaline Phosphatase: 107 IU/L (ref 44–121)
BUN/Creatinine Ratio: 15 (ref 9–23)
BUN: 15 mg/dL (ref 6–24)
Bilirubin Total: 0.7 mg/dL (ref 0.0–1.2)
CO2: 26 mmol/L (ref 20–29)
Calcium: 10.3 mg/dL — ABNORMAL HIGH (ref 8.7–10.2)
Chloride: 100 mmol/L (ref 96–106)
Creatinine, Ser: 1.02 mg/dL — ABNORMAL HIGH (ref 0.57–1.00)
Globulin, Total: 2.6 g/dL (ref 1.5–4.5)
Glucose: 95 mg/dL (ref 70–99)
Potassium: 3.4 mmol/L — ABNORMAL LOW (ref 3.5–5.2)
Sodium: 141 mmol/L (ref 134–144)
Total Protein: 7.5 g/dL (ref 6.0–8.5)
eGFR: 67 mL/min/{1.73_m2} (ref >59)

## 2022-03-11 LAB — CBC WITH DIFFERENTIAL/PLATELET
Basophils Absolute: 0 10*3/uL (ref 0.0–0.2)
Basos: 1 %
EOS (ABSOLUTE): 0.1 10*3/uL (ref 0.0–0.4)
Eos: 2 %
Hematocrit: 40.9 % (ref 34.0–46.6)
Hemoglobin: 13.8 g/dL (ref 11.1–15.9)
Immature Grans (Abs): 0 10*3/uL (ref 0.0–0.1)
Immature Granulocytes: 0 %
Lymphocytes Absolute: 2.4 10*3/uL (ref 0.7–3.1)
Lymphs: 44 %
MCH: 30.1 pg (ref 26.6–33.0)
MCHC: 33.7 g/dL (ref 31.5–35.7)
MCV: 89 fL (ref 79–97)
Monocytes Absolute: 0.5 10*3/uL (ref 0.1–0.9)
Monocytes: 9 %
Neutrophils Absolute: 2.3 10*3/uL (ref 1.4–7.0)
Neutrophils: 44 %
Platelets: 310 10*3/uL (ref 150–450)
RBC: 4.58 x10E6/uL (ref 3.77–5.28)
RDW: 12.7 % (ref 11.7–15.4)
WBC: 5.3 10*3/uL (ref 3.4–10.8)

## 2022-03-11 LAB — VITAMIN D 1,25 DIHYDROXY
Vitamin D 1, 25 (OH)2 Total: 59 pg/mL
Vitamin D2 1, 25 (OH)2: <10 pg/mL
Vitamin D3 1, 25 (OH)2: 59 pg/mL

## 2022-03-11 LAB — LIPID PANEL
Chol/HDL Ratio: 2.1 ratio (ref 0.0–4.4)
Cholesterol, Total: 141 mg/dL (ref 100–199)
HDL: 66 mg/dL (ref >39)
LDL Chol Calc (NIH): 62 mg/dL (ref 0–99)
Triglycerides: 65 mg/dL (ref 0–149)
VLDL Cholesterol Cal: 13 mg/dL (ref 5–40)

## 2022-03-11 LAB — HEMOGLOBIN A1C
Est. average glucose Bld gHb Est-mCnc: 128 mg/dL
Hgb A1c MFr Bld: 6.1 % — ABNORMAL HIGH (ref 4.8–5.6)

## 2022-03-11 LAB — TSH+FREE T4
Free T4: 1.14 ng/dL (ref 0.82–1.77)
TSH: 2.63 u[IU]/mL (ref 0.450–4.500)

## 2022-03-11 LAB — HEPATITIS C ANTIBODY: Hep C Virus Ab: NONREACTIVE

## 2022-03-12 LAB — CUP PACEART REMOTE DEVICE CHECK
Date Time Interrogation Session: 20230406003740
Implantable Pulse Generator Implant Date: 20200612

## 2022-03-26 NOTE — Progress Notes (Signed)
Carelink Summary Report / Loop Recorder 

## 2022-04-14 ENCOUNTER — Ambulatory Visit (INDEPENDENT_AMBULATORY_CARE_PROVIDER_SITE_OTHER): Payer: BC Managed Care – PPO

## 2022-04-14 DIAGNOSIS — I639 Cerebral infarction, unspecified: Secondary | ICD-10-CM

## 2022-04-15 LAB — CUP PACEART REMOTE DEVICE CHECK
Date Time Interrogation Session: 20230510230407
Implantable Pulse Generator Implant Date: 20200612

## 2022-05-05 NOTE — Progress Notes (Signed)
Carelink Summary Report / Loop Recorder 

## 2022-06-16 ENCOUNTER — Ambulatory Visit
Admission: EM | Admit: 2022-06-16 | Discharge: 2022-06-16 | Disposition: A | Discharge status: Home / Self Care | Payer: BC Managed Care – PPO | Attending: Nurse Practitioner | Admitting: Nurse Practitioner

## 2022-06-16 ENCOUNTER — Ambulatory Visit (INDEPENDENT_AMBULATORY_CARE_PROVIDER_SITE_OTHER): Payer: BC Managed Care – PPO

## 2022-06-16 DIAGNOSIS — S40862A Insect bite (nonvenomous) of left upper arm, initial encounter: Secondary | ICD-10-CM | POA: Diagnosis not present

## 2022-06-16 DIAGNOSIS — W57XXXA Bitten or stung by nonvenomous insect and other nonvenomous arthropods, initial encounter: Secondary | ICD-10-CM

## 2022-06-16 DIAGNOSIS — R059 Cough, unspecified: Secondary | ICD-10-CM

## 2022-06-16 DIAGNOSIS — I639 Cerebral infarction, unspecified: Secondary | ICD-10-CM | POA: Diagnosis not present

## 2022-06-16 MED ORDER — TRIAMCINOLONE ACETONIDE 0.1 % EX CREA: 1.0000 | TOPICAL_CREAM | Freq: Two times a day (BID) | CUTANEOUS | 0 refills | Status: AC

## 2022-06-16 MED ORDER — PSEUDOEPH-BROMPHEN-DM 30-2-10 MG/5ML PO SYRP: 5.0000 mL | ORAL_SOLUTION | Freq: Four times a day (QID) | ORAL | 0 refills | Status: DC | PRN

## 2022-06-16 MED ORDER — PREDNISONE 20 MG PO TABS: 40.0000 mg | ORAL_TABLET | Freq: Every day | ORAL | 0 refills | Status: AC

## 2022-06-16 NOTE — ED Provider Notes (Signed)
[facility]    CSN: [id number] Arrival date & time: 06/16/22  1755      History   Chief Complaint Chief Complaint  Patient presents with  . Insect Bite    HPI Carolyn Mclaughlin is a 51 y.o. female.   HPI  Patient presents for complaints of cough and insect bites to the left upper arm.  Insect bites occurred approximately 2 days ago after she was at her grandsons birthday party.  Bites are localized to the back of the left upper arm.  Patient states rash is itching.  She states she has used vinegar and took Benadryl for her symptoms with minimal relief.  She denies use of new soaps, detergents, medications, foods, lotions.   Patient also complains of a "dry" cough that started today.  She denies fever, chills, chest pain, shortness of breath or wheezing.  Patient reports that she was at a birthday party approximately 2 days ago.  She states she took Mucinex when she was at work today for her symptoms.   Past Medical History:  Diagnosis Date  . Headache   . Incarcerated epigastric hernia 04/26/2019  . Stroke ([facility]) 05/10/2019  . Umbilical hernia 04/26/2019    Patient Active Problem List   Diagnosis Date Noted  . History of CVA (cerebrovascular accident) 02/28/2022  . Essential hypertension 02/28/2022  . Dizziness 02/28/2022  . Elevated liver enzymes 05/13/2019  . Memory problem 05/13/2019  . PFO (patent foramen ovale) 05/13/2019  . Elevated antinuclear antibody (ANA) level 05/13/2019  . Acute ischemic stroke ([facility]) 05/10/2019  . Incarcerated epigastric hernia 04/26/2019  . Umbilical hernia 04/26/2019    Past Surgical History:  Procedure Laterality Date  . BUBBLE STUDY  05/13/2019   Procedure: BUBBLE STUDY;  Surgeon: Parke Poisson, MD;  Location: [facility];  Service: Cardiology;;  . LOOP RECORDER INSERTION Left 05/13/2019   Procedure: LOOP RECORDER INSERTION;  Surgeon: Regan Lemming, MD;  [facility];  Service:  Cardiovascular;  Laterality: Left;  . NO PAST SURGERIES    . TEE WITHOUT CARDIOVERSION N/A 05/13/2019   Procedure: TRANSESOPHAGEAL ECHOCARDIOGRAM (TEE);  Surgeon: Parke Poisson, MD;  Location: [facility] ENDOSCOPY;  Service: Cardiology;  Laterality: N/A;  . VENTRAL HERNIA REPAIR N/A 04/26/2019   Procedure: LAPRASCOPIC REPAIR OF INCARCERATED EPIGASTRIC HERNIA WITH MESH;  Surgeon: Claud Kelp, MD;  Location: [facility];  Service: General;  Laterality: N/A;    OB History   No obstetric history on file.      Home Medications    Prior to Admission medications   Medication Sig Start Date End Date Taking? Authorizing Provider  amLODipine (NORVASC) 2.5 MG tablet Take 1 tablet (2.5 mg total) by mouth daily. 02/28/22   Anabel Halon, MD  aspirin EC 81 MG tablet Take 1 tablet (81 mg total) by mouth daily. Swallow whole. Patient not taking: Reported on 02/28/2022 07/11/21   [name], MD  hydrochlorothiazide (HYDRODIURIL) 25 MG tablet Take 1 tablet (25 mg total) by mouth every morning. 02/28/22   Anabel Halon, MD  meclizine (ANTIVERT) 25 MG tablet Take 1 tablet (25 mg total) by mouth 3 (three) times daily as needed for dizziness. 02/28/22   Anabel Halon, MD  rosuvastatin (CRESTOR) 10 MG tablet Take 1 tablet (10 mg total) by mouth daily. 07/11/21   [name], MD    Family History Family History  Family history unknown: Yes    Social History Social History   Tobacco  Use  . Smoking status: Never  . Smokeless tobacco: Never  Vaping Use  . Vaping Use: Never used  Substance Use Topics  . Alcohol use: Yes    Comment: socially-wine  . Drug use: Never     Allergies   Patient has no known allergies.   Review of Systems Review of Systems Per HPI  Physical Exam Triage Vital Signs ED Triage Vitals  Enc Vitals Group     BP 06/16/22 1806 133/88     Pulse Rate 06/16/22 1806 (!) 102     Resp 06/16/22 1806 18     Temp 06/16/22 1806 98.1 F (36.7 C)     Temp src --      SpO2  06/16/22 1806 98 %     Weight --      Height --      Head Circumference --      Peak Flow --      Pain Score 06/16/22 1803 0     Pain Loc --      Pain Edu? --      Excl. in GC? --    No data found.  Updated Vital Signs BP 133/88   Pulse (!) 102   Temp 98.1 F (36.7 C)   Resp 18   SpO2 98%   Visual Acuity Right Eye Distance:   Left Eye Distance:   Bilateral Distance:    Right Eye Near:   Left Eye Near:    Bilateral Near:     Physical Exam   UC Treatments / Results  Labs (all labs ordered are listed, but only abnormal results are displayed) Labs Reviewed - No data to display  EKG   Radiology No results found.  Procedures Procedures (including critical care time)  Medications Ordered in UC Medications - No data to display  Initial Impression / Assessment and Plan / UC Course  I have reviewed the triage vital signs and the nursing notes.  Pertinent labs & imaging results that were available during my care of the patient were reviewed by me and considered in my medical decision making (see chart for details).     *** Final Clinical Impressions(s) / UC Diagnoses   Final diagnoses:  None   Discharge Instructions   None    ED Prescriptions   None    PDMP not reviewed this encounter.

## 2022-06-16 NOTE — Discharge Instructions (Signed)
Take medication as prescribed. Increase fluids and allow for plenty of rest. Recommend Tylenol or Ibuprofen as needed for pain, fever, or general discomfort. Recommend using a humidifier at bedtime during sleep to help with cough. Sleep elevated on 2 pillows while cough symptoms persist. Follow-up if your symptoms do not improve or sooner if they worsen.

## 2022-06-16 NOTE — ED Triage Notes (Signed)
Pt presents with c/o multiple insect bites  under left arm for past couple of days and then pt developed cough today

## 2022-06-18 LAB — COVID-19, FLU A+B NAA
Influenza A, NAA: NOT DETECTED
Influenza B, NAA: NOT DETECTED
SARS-CoV-2, NAA: NOT DETECTED

## 2022-06-24 LAB — CUP PACEART REMOTE DEVICE CHECK
Date Time Interrogation Session: 20230715231631
Implantable Pulse Generator Implant Date: 20200612

## 2022-07-01 ENCOUNTER — Other Ambulatory Visit: Payer: Self-pay | Admitting: Internal Medicine

## 2022-07-17 NOTE — Progress Notes (Signed)
Carelink Summary Report / Loop Recorder 

## 2022-07-21 ENCOUNTER — Ambulatory Visit (INDEPENDENT_AMBULATORY_CARE_PROVIDER_SITE_OTHER): Payer: BC Managed Care – PPO

## 2022-07-21 DIAGNOSIS — I639 Cerebral infarction, unspecified: Secondary | ICD-10-CM

## 2022-07-22 LAB — CUP PACEART REMOTE DEVICE CHECK
Date Time Interrogation Session: 20230817232111
Implantable Pulse Generator Implant Date: 20200612

## 2022-07-27 ENCOUNTER — Other Ambulatory Visit: Payer: Self-pay | Admitting: Internal Medicine

## 2022-08-18 NOTE — Progress Notes (Signed)
Carelink Summary Report / Loop Recorder 

## 2022-08-25 ENCOUNTER — Ambulatory Visit (INDEPENDENT_AMBULATORY_CARE_PROVIDER_SITE_OTHER): Payer: BC Managed Care – PPO

## 2022-08-25 DIAGNOSIS — I639 Cerebral infarction, unspecified: Secondary | ICD-10-CM

## 2022-08-26 LAB — CUP PACEART REMOTE DEVICE CHECK
Date Time Interrogation Session: 20230919232617
Implantable Pulse Generator Implant Date: 20200612

## 2022-09-01 ENCOUNTER — Encounter: Payer: BC Managed Care – PPO | Admitting: Family Medicine

## 2022-09-10 NOTE — Progress Notes (Signed)
Carelink Summary Report / Loop Recorder 

## 2022-09-26 LAB — CUP PACEART REMOTE DEVICE CHECK
Date Time Interrogation Session: 20231022233152
Implantable Pulse Generator Implant Date: 20200612

## 2022-09-29 ENCOUNTER — Ambulatory Visit (INDEPENDENT_AMBULATORY_CARE_PROVIDER_SITE_OTHER): Payer: BC Managed Care – PPO

## 2022-09-29 DIAGNOSIS — I639 Cerebral infarction, unspecified: Secondary | ICD-10-CM

## 2022-10-25 ENCOUNTER — Other Ambulatory Visit: Payer: Self-pay | Admitting: Internal Medicine

## 2022-11-01 NOTE — Progress Notes (Signed)
Carelink Summary Report / Loop Recorder 

## 2022-11-03 ENCOUNTER — Ambulatory Visit (INDEPENDENT_AMBULATORY_CARE_PROVIDER_SITE_OTHER): Payer: BC Managed Care – PPO

## 2022-11-03 DIAGNOSIS — I639 Cerebral infarction, unspecified: Secondary | ICD-10-CM | POA: Diagnosis not present

## 2022-11-03 LAB — CUP PACEART REMOTE DEVICE CHECK
Date Time Interrogation Session: 20231203232516
Implantable Pulse Generator Implant Date: 20200612

## 2022-12-08 ENCOUNTER — Ambulatory Visit (INDEPENDENT_AMBULATORY_CARE_PROVIDER_SITE_OTHER): Payer: BC Managed Care – PPO

## 2022-12-08 DIAGNOSIS — I639 Cerebral infarction, unspecified: Secondary | ICD-10-CM | POA: Diagnosis not present

## 2022-12-09 LAB — CUP PACEART REMOTE DEVICE CHECK
Date Time Interrogation Session: 20240107232851
Implantable Pulse Generator Implant Date: 20200612

## 2022-12-11 NOTE — Progress Notes (Signed)
Carelink Summary Report / Loop Recorder 

## 2023-01-12 ENCOUNTER — Ambulatory Visit: Payer: BC Managed Care – PPO

## 2023-01-12 DIAGNOSIS — I639 Cerebral infarction, unspecified: Secondary | ICD-10-CM | POA: Diagnosis not present

## 2023-01-13 LAB — CUP PACEART REMOTE DEVICE CHECK
Date Time Interrogation Session: 20240209233050
Implantable Pulse Generator Implant Date: 20200612

## 2023-01-15 NOTE — Progress Notes (Signed)
Carelink Summary Report / Loop Recorder 

## 2023-01-29 ENCOUNTER — Encounter: Payer: Self-pay | Admitting: Radiology

## 2023-02-15 LAB — CUP PACEART REMOTE DEVICE CHECK
Date Time Interrogation Session: 20240314003452
Implantable Pulse Generator Implant Date: 20200612

## 2023-02-16 ENCOUNTER — Ambulatory Visit: Payer: BC Managed Care – PPO

## 2023-02-19 ENCOUNTER — Telehealth: Payer: Self-pay

## 2023-02-19 NOTE — Telephone Encounter (Addendum)
ILR alert report received. Battery status shows RRT, sent to triage. Normal device function. No new symptom, tachy, brady, or pause episodes. No new AF episodes. AF burden is 0% of the time.  Monthly summary reports and ROV/PRN  Kathy Breach, RN, CCDS, [employer]  ILR @ RRT   ILR reached RRT: 02/18/23 Patient called, discussed options to leave device in or explanted.  Patient would like to have time to consider whether to remove or not and will call back if wishes to remove.  Marked "I" in Paceart:yes  Enter note in Paceart:yes Canceled future remotes: yes Discontinued from website: yes Entered in Speciality Comments: yes  Advised if further questions arise to please call the device clinic at [phone].

## 2023-02-26 NOTE — Progress Notes (Signed)
Carelink Summary Report / Loop Recorder 

## 2023-03-23 ENCOUNTER — Ambulatory Visit: Payer: BC Managed Care – PPO

## 2023-04-28 ENCOUNTER — Ambulatory Visit: Payer: BC Managed Care – PPO

## 2023-06-01 ENCOUNTER — Ambulatory Visit: Payer: BC Managed Care – PPO

## 2023-07-04 ENCOUNTER — Encounter: Payer: Self-pay | Admitting: Emergency Medicine

## 2023-07-04 ENCOUNTER — Ambulatory Visit
Admission: EM | Admit: 2023-07-04 | Discharge: 2023-07-04 | Disposition: A | Discharge status: Home / Self Care | Payer: BC Managed Care – PPO | Attending: Nurse Practitioner | Admitting: Nurse Practitioner

## 2023-07-04 DIAGNOSIS — Z20822 Contact with and (suspected) exposure to covid-19: Secondary | ICD-10-CM | POA: Insufficient documentation

## 2023-07-04 HISTORY — DX: Essential (primary) hypertension: I10

## 2023-07-04 NOTE — ED Provider Notes (Signed)
[facility]    CSN: [id number] Arrival date & time: 07/04/23  1131      History   Chief Complaint No chief complaint on file.   HPI Carolyn Mclaughlin is a 52 y.o. female.   The history is provided by the patient.   Patient presents for COVID testing after recent exposure over the past week.  Patient states that she experienced fatigue yesterday, and stomach upset.  She also states that she has a cough.  Patient states that she does have an underlying dry cough at baseline, denies changes in the cough.  She further denies fever, chills, headache, sore throat, ear pain, wheezing, shortness of breath, chest pain, abdominal pain, vomiting or diarrhea.  Patient states that her family is urging her to get tested for COVID.    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   Umbilical hernia 04/26/2019    Past Surgical History:  Procedure Laterality Date   BUBBLE STUDY  05/13/2019   Procedure: BUBBLE STUDY;  Surgeon: Parke Poisson, MD;  Location: [facility] ENDOSCOPY;  Service: Cardiology;;   LOOP RECORDER INSERTION Left 05/13/2019   Procedure: LOOP RECORDER INSERTION;  Surgeon: Regan Lemming, MD;  Location: [facility] INVASIVE CV LAB;  Service: Cardiovascular;  Laterality: Left;   NO PAST SURGERIES     TEE WITHOUT CARDIOVERSION N/A 05/13/2019   Procedure: TRANSESOPHAGEAL ECHOCARDIOGRAM (TEE);  Surgeon: Parke Poisson, MD;  Location:  [facility] ENDOSCOPY;  Service: Cardiology;  Laterality: N/A;   VENTRAL HERNIA REPAIR N/A 04/26/2019   Procedure: LAPRASCOPIC REPAIR OF INCARCERATED EPIGASTRIC HERNIA WITH MESH;  Surgeon: Claud Kelp, MD;  Location: [facility] ORS;  Service: General;  Laterality: N/A;    OB History   No obstetric history on file.      Home Medications    Prior to Admission medications   Medication Sig Start Date End Date Taking? Authorizing Provider  amLODipine (NORVASC) 2.5 MG tablet Take 1 tablet (2.5 mg total) by mouth daily. 02/28/22   Anabel Halon, MD  hydrochlorothiazide (HYDRODIURIL) 25 MG tablet TAKE 1 TABLET(25 MG) BY MOUTH EVERY MORNING 10/27/22   Anabel Halon, MD  meclizine (ANTIVERT) 25 MG tablet Take 1 tablet (25 mg total) by mouth 3 (three) times daily as needed for dizziness. 02/28/22   Anabel Halon, MD  rosuvastatin (CRESTOR) 10 MG tablet TAKE 1 TABLET(10 MG) BY MOUTH DAILY 07/29/22   [name], MD  triamcinolone cream (KENALOG) 0.1 % Apply 1 Application topically 2 (two) times daily. 06/16/22   Idabell Picking-Warren, Sadie Haber, NP    Family History Family History  Family history unknown: Yes    Social History Social History   Tobacco Use   Smoking status: Never   Smokeless tobacco: Never  Vaping Use   Vaping status: Never Used  Substance Use Topics   Alcohol use: Yes    Comment: socially-wine   Drug use: Never     Allergies   Patient has no  known allergies.   Review of Systems Review of Systems Per HPI  Physical Exam Triage Vital Signs ED Triage Vitals  Encounter Vitals Group     BP 07/04/23 1143 133/87     Systolic BP Percentile --      Diastolic BP Percentile --      Pulse Rate 07/04/23 1143 83     Resp 07/04/23 1143 16     Temp 07/04/23 1143 98.1 F (36.7 C)     Temp Source 07/04/23 1143 Oral     SpO2 07/04/23 1143 96 %     Weight --      Height --      Head Circumference --      Peak Flow --      Pain Score 07/04/23 1145 0     Pain Loc --      Pain  Education --      Exclude from Growth Chart --    No data found.  Updated Vital Signs BP 133/87 (BP Location: Right Arm)   Pulse 83   Temp 98.1 F (36.7 C) (Oral)   Resp 16   SpO2 96%   Visual Acuity Right Eye Distance:   Left Eye Distance:   Bilateral Distance:    Right Eye Near:   Left Eye Near:    Bilateral Near:     Physical Exam Vitals and nursing note reviewed.  Constitutional:      General: She is not in acute distress.    Appearance: Normal appearance.  HENT:     Head: Normocephalic.     Right Ear: Tympanic membrane, ear canal and external ear normal.     Left Ear: Tympanic membrane, ear canal and external ear normal.     Nose: Nose normal.     Mouth/Throat:     Mouth: Mucous membranes are moist.     Pharynx: No posterior oropharyngeal erythema.  Eyes:     Extraocular Movements: Extraocular movements intact.     Conjunctiva/sclera: Conjunctivae normal.     Pupils: Pupils are equal, round, and reactive to light.  Cardiovascular:     Rate and Rhythm: Normal rate and regular rhythm.     Pulses: Normal pulses.     Heart sounds: Normal heart sounds.  Pulmonary:     Effort: Pulmonary effort is normal. No respiratory distress.     Breath sounds: Normal breath sounds. No stridor. No wheezing, rhonchi or rales.  Abdominal:     General: Bowel sounds are normal.     Palpations: Abdomen is soft.     Tenderness: There is no abdominal tenderness.  Musculoskeletal:     Cervical back: Normal range of motion.  Lymphadenopathy:     Cervical: No cervical adenopathy.  Skin:    General: Skin is warm and dry.  Neurological:     General: No focal deficit present.     Mental Status: She is alert and oriented to person, place, and time.  Psychiatric:        Mood and Affect: Mood normal.        Behavior: Behavior normal.      UC Treatments / Results  Labs (all labs ordered are listed, but only abnormal results are displayed) Labs Reviewed  SARS CORONAVIRUS 2 (TAT  6-24 HRS)    EKG   Radiology No results found.  Procedures Procedures (including critical care time)  Medications Ordered in UC Medications - No data to display  Initial Impression / Assessment and Plan / UC  Course  I have reviewed the triage vital signs and the nursing notes.  Pertinent labs & imaging results that were available during my care of the patient were reviewed by me and considered in my medical decision making (see chart for details).  The patient is well-appearing, she is in no acute distress, vital signs are stable.  COVID test is pending.  Patient advised that she is a candidate to receive antiviral therapy if her COVID test is positive.  Would recommend molnupiravir if the COVID test is positive.  Supportive care recommendations were provided and discussed with the patient to include continue to wear her mask until she gets her test results, and to follow-up if she has new or worsening symptoms.  Patient is in agreement with this plan of care and verbalizes understanding.  All questions were answered.  Patient stable for discharge.  Final Clinical Impressions(s) / UC Diagnoses   Final diagnoses:  Exposure to COVID-19 virus     Discharge Instructions      COVID test is pending.  You will be contacted if the COVID test is positive.  You can call this office on tomorrow around 3 PM if you do not have access to MyChart. Continue to wear your mask until symptoms are reported. Follow-up in this clinic for new or worsening symptoms as needed. Follow-up as needed.      ED Prescriptions   None    PDMP not reviewed this encounter.   Abran Cantor, NP 07/04/23 1236

## 2023-07-04 NOTE — Discharge Instructions (Addendum)
COVID test is pending.  You will be contacted if the COVID test is positive.  You can call this office on tomorrow around 3 PM if you do not have access to MyChart. Continue to wear your mask until symptoms are reported. Follow-up in this clinic for new or worsening symptoms as needed. Follow-up as needed.

## 2023-07-04 NOTE — ED Triage Notes (Signed)
Exposed to covid from co-workers.  Wants a covid test. Stats its normal for her to have a dry cough.  States she did feel tired yesterday.

## 2023-07-05 LAB — SARS CORONAVIRUS 2 (TAT 6-24 HRS): SARS Coronavirus 2: NEGATIVE

## 2024-02-17 ENCOUNTER — Encounter: Payer: Self-pay | Admitting: Internal Medicine

## 2024-03-22 ENCOUNTER — Telehealth: Payer: Self-pay | Admitting: Internal Medicine

## 2024-03-22 NOTE — Telephone Encounter (Signed)
 Deleting recall- unable to contact pt to sch after multiple attempts

## 2025-01-02 ENCOUNTER — Ambulatory Visit
Admission: EM | Admit: 2025-01-02 | Discharge: 2025-01-02 | Disposition: A | Discharge status: Home / Self Care | Source: Home / Self Care

## 2025-01-02 DIAGNOSIS — J069 Acute upper respiratory infection, unspecified: Secondary | ICD-10-CM | POA: Diagnosis not present

## 2025-01-02 DIAGNOSIS — Z20828 Contact with and (suspected) exposure to other viral communicable diseases: Secondary | ICD-10-CM

## 2025-01-02 MED ORDER — PROMETHAZINE-DM 6.25-15 MG/5ML PO SYRP: 5.0000 mL | ORAL_SOLUTION | Freq: Four times a day (QID) | ORAL | 0 refills | Status: AC | PRN

## 2025-01-02 MED ORDER — AZELASTINE HCL 0.1 % NA SOLN: 1.0000 | Freq: Two times a day (BID) | NASAL | 0 refills | Status: AC

## 2025-01-02 MED ORDER — OSELTAMIVIR PHOSPHATE 75 MG PO CAPS: 75.0000 mg | ORAL_CAPSULE | Freq: Two times a day (BID) | ORAL | 0 refills | Status: AC

## 2025-01-02 NOTE — Discharge Instructions (Signed)
 In addition to the prescribed medications, you may take Coricidin HBP, plain Mucinex, use saline sinus rinses, humidifiers, Tylenol  as needed.  Follow-up for worsening or unresolving symptoms.

## 2025-01-02 NOTE — ED Triage Notes (Signed)
 Pt reports chills, diarrhea, fever cough congestion, weak, fatigued, lightheaded, started last Tuesday of last week.
